# Patient Record
Sex: Female | Born: 1975 | Race: Black or African American | Hispanic: No | Marital: Married | Smoking: Never smoker
Health system: Southern US, Community
[De-identification: ages and names within clinical notes are randomized; demographics above are authoritative.]

## PROBLEM LIST (undated history)

## (undated) ENCOUNTER — Inpatient Hospital Stay (HOSPITAL_COMMUNITY): Payer: Self-pay

## (undated) DIAGNOSIS — IMO0002 Reserved for concepts with insufficient information to code with codable children: Secondary | ICD-10-CM

## (undated) DIAGNOSIS — N189 Chronic kidney disease, unspecified: Secondary | ICD-10-CM

## (undated) DIAGNOSIS — M329 Systemic lupus erythematosus, unspecified: Secondary | ICD-10-CM

## (undated) HISTORY — PX: KIDNEY TRANSPLANT: SHX239

---

## 1999-07-01 ENCOUNTER — Other Ambulatory Visit: Admission: RE | Admit: 1999-07-01 | Discharge: 1999-07-01 | Payer: Self-pay | Admitting: Gynecology

## 1999-11-03 ENCOUNTER — Encounter: Admission: RE | Admit: 1999-11-03 | Discharge: 2000-02-01 | Payer: Self-pay | Admitting: Gynecology

## 2000-01-29 ENCOUNTER — Observation Stay (HOSPITAL_COMMUNITY): Admission: AD | Admit: 2000-01-29 | Discharge: 2000-01-29 | Payer: Self-pay | Admitting: Gynecology

## 2000-01-30 ENCOUNTER — Inpatient Hospital Stay (HOSPITAL_COMMUNITY): Admission: AD | Admit: 2000-01-30 | Discharge: 2000-01-30 | Payer: Self-pay | Admitting: Gynecology

## 2000-01-31 ENCOUNTER — Inpatient Hospital Stay (HOSPITAL_COMMUNITY): Admission: AD | Admit: 2000-01-31 | Discharge: 2000-02-02 | Payer: Self-pay | Admitting: Gynecology

## 2000-03-13 ENCOUNTER — Other Ambulatory Visit: Admission: RE | Admit: 2000-03-13 | Discharge: 2000-03-13 | Payer: Self-pay | Admitting: Gynecology

## 2001-04-14 ENCOUNTER — Emergency Department (HOSPITAL_COMMUNITY): Admission: EM | Admit: 2001-04-14 | Discharge: 2001-04-15 | Payer: Self-pay | Admitting: *Deleted

## 2001-07-24 ENCOUNTER — Other Ambulatory Visit: Admission: RE | Admit: 2001-07-24 | Discharge: 2001-07-24 | Payer: Self-pay | Admitting: Family Medicine

## 2002-11-09 ENCOUNTER — Inpatient Hospital Stay (HOSPITAL_COMMUNITY): Admission: RE | Admit: 2002-11-09 | Discharge: 2002-11-12 | Payer: Self-pay | Admitting: Family Medicine

## 2002-11-09 ENCOUNTER — Encounter: Payer: Self-pay | Admitting: Family Medicine

## 2002-11-10 ENCOUNTER — Encounter: Payer: Self-pay | Admitting: Pulmonary Disease

## 2002-11-11 ENCOUNTER — Encounter: Payer: Self-pay | Admitting: Cardiovascular Disease

## 2002-11-26 ENCOUNTER — Encounter: Admission: RE | Admit: 2002-11-26 | Discharge: 2002-11-26 | Payer: Self-pay | Admitting: Family Medicine

## 2002-11-26 ENCOUNTER — Encounter: Payer: Self-pay | Admitting: Family Medicine

## 2002-12-02 ENCOUNTER — Ambulatory Visit (HOSPITAL_COMMUNITY): Admission: RE | Admit: 2002-12-02 | Discharge: 2002-12-02 | Payer: Self-pay | Admitting: Cardiology

## 2002-12-02 ENCOUNTER — Encounter (INDEPENDENT_AMBULATORY_CARE_PROVIDER_SITE_OTHER): Payer: Self-pay | Admitting: Cardiology

## 2003-05-19 ENCOUNTER — Inpatient Hospital Stay (HOSPITAL_COMMUNITY): Admission: EM | Admit: 2003-05-19 | Discharge: 2003-05-27 | Payer: Self-pay | Admitting: Emergency Medicine

## 2003-05-26 ENCOUNTER — Encounter (INDEPENDENT_AMBULATORY_CARE_PROVIDER_SITE_OTHER): Payer: Self-pay | Admitting: *Deleted

## 2003-06-01 ENCOUNTER — Ambulatory Visit (HOSPITAL_COMMUNITY): Admission: RE | Admit: 2003-06-01 | Discharge: 2003-06-01 | Payer: Self-pay | Admitting: Nephrology

## 2003-06-02 ENCOUNTER — Ambulatory Visit (HOSPITAL_COMMUNITY): Admission: RE | Admit: 2003-06-02 | Discharge: 2003-06-02 | Payer: Self-pay | Admitting: Nephrology

## 2003-06-02 ENCOUNTER — Inpatient Hospital Stay (HOSPITAL_COMMUNITY): Admission: EM | Admit: 2003-06-02 | Discharge: 2003-06-05 | Payer: Self-pay | Admitting: Emergency Medicine

## 2003-06-03 ENCOUNTER — Encounter (INDEPENDENT_AMBULATORY_CARE_PROVIDER_SITE_OTHER): Payer: Self-pay | Admitting: Cardiology

## 2003-06-17 ENCOUNTER — Ambulatory Visit (HOSPITAL_COMMUNITY): Admission: RE | Admit: 2003-06-17 | Discharge: 2003-06-17 | Payer: Self-pay | Admitting: Nephrology

## 2003-07-08 ENCOUNTER — Encounter (HOSPITAL_COMMUNITY): Admission: RE | Admit: 2003-07-08 | Discharge: 2003-10-06 | Payer: Self-pay | Admitting: Nephrology

## 2003-12-09 ENCOUNTER — Ambulatory Visit (HOSPITAL_COMMUNITY): Admission: RE | Admit: 2003-12-09 | Discharge: 2003-12-09 | Payer: Self-pay | Admitting: General Surgery

## 2003-12-10 ENCOUNTER — Observation Stay (HOSPITAL_COMMUNITY): Admission: RE | Admit: 2003-12-10 | Discharge: 2003-12-12 | Payer: Self-pay | Admitting: General Surgery

## 2004-01-26 ENCOUNTER — Ambulatory Visit (HOSPITAL_COMMUNITY): Admission: RE | Admit: 2004-01-26 | Discharge: 2004-01-26 | Payer: Self-pay | Admitting: Nephrology

## 2004-03-19 ENCOUNTER — Emergency Department (HOSPITAL_COMMUNITY): Admission: EM | Admit: 2004-03-19 | Discharge: 2004-03-19 | Payer: Self-pay | Admitting: Emergency Medicine

## 2004-03-29 ENCOUNTER — Inpatient Hospital Stay (HOSPITAL_COMMUNITY): Admission: RE | Admit: 2004-03-29 | Discharge: 2004-04-02 | Payer: Self-pay | Admitting: Nephrology

## 2004-03-29 ENCOUNTER — Encounter (INDEPENDENT_AMBULATORY_CARE_PROVIDER_SITE_OTHER): Payer: Self-pay | Admitting: Cardiology

## 2004-03-30 ENCOUNTER — Encounter (INDEPENDENT_AMBULATORY_CARE_PROVIDER_SITE_OTHER): Payer: Self-pay | Admitting: Cardiology

## 2004-03-30 ENCOUNTER — Encounter (INDEPENDENT_AMBULATORY_CARE_PROVIDER_SITE_OTHER): Payer: Self-pay | Admitting: *Deleted

## 2004-04-01 ENCOUNTER — Encounter (INDEPENDENT_AMBULATORY_CARE_PROVIDER_SITE_OTHER): Payer: Self-pay | Admitting: Cardiovascular Disease

## 2004-06-15 ENCOUNTER — Encounter: Admission: RE | Admit: 2004-06-15 | Discharge: 2004-06-15 | Payer: Self-pay | Admitting: Nephrology

## 2004-07-08 ENCOUNTER — Encounter: Admission: RE | Admit: 2004-07-08 | Discharge: 2004-07-08 | Payer: Self-pay | Admitting: Nephrology

## 2004-07-22 ENCOUNTER — Ambulatory Visit (HOSPITAL_COMMUNITY): Admission: RE | Admit: 2004-07-22 | Discharge: 2004-07-22 | Payer: Self-pay | Admitting: Nephrology

## 2004-07-22 ENCOUNTER — Encounter (INDEPENDENT_AMBULATORY_CARE_PROVIDER_SITE_OTHER): Payer: Self-pay | Admitting: *Deleted

## 2004-07-27 ENCOUNTER — Other Ambulatory Visit: Admission: RE | Admit: 2004-07-27 | Discharge: 2004-07-27 | Payer: Self-pay | Admitting: Family Medicine

## 2004-08-12 ENCOUNTER — Encounter: Admission: RE | Admit: 2004-08-12 | Discharge: 2004-08-12 | Payer: Self-pay | Admitting: Nephrology

## 2004-08-27 ENCOUNTER — Inpatient Hospital Stay (HOSPITAL_COMMUNITY): Admission: EM | Admit: 2004-08-27 | Discharge: 2004-09-01 | Payer: Self-pay | Admitting: Emergency Medicine

## 2004-10-14 ENCOUNTER — Ambulatory Visit (HOSPITAL_COMMUNITY): Admission: RE | Admit: 2004-10-14 | Discharge: 2004-10-14 | Payer: Self-pay | Admitting: Gastroenterology

## 2004-10-14 ENCOUNTER — Encounter (INDEPENDENT_AMBULATORY_CARE_PROVIDER_SITE_OTHER): Payer: Self-pay | Admitting: Specialist

## 2004-10-26 ENCOUNTER — Inpatient Hospital Stay (HOSPITAL_COMMUNITY): Admission: EM | Admit: 2004-10-26 | Discharge: 2004-10-29 | Payer: Self-pay | Admitting: Emergency Medicine

## 2004-12-14 ENCOUNTER — Ambulatory Visit (HOSPITAL_COMMUNITY): Admission: RE | Admit: 2004-12-14 | Discharge: 2004-12-15 | Payer: Self-pay | Admitting: Nephrology

## 2004-12-22 ENCOUNTER — Ambulatory Visit (HOSPITAL_COMMUNITY): Admission: RE | Admit: 2004-12-22 | Discharge: 2004-12-22 | Payer: Self-pay | Admitting: Nephrology

## 2005-01-14 ENCOUNTER — Ambulatory Visit (HOSPITAL_COMMUNITY): Admission: RE | Admit: 2005-01-14 | Discharge: 2005-01-14 | Payer: Self-pay | Admitting: Nephrology

## 2005-01-17 ENCOUNTER — Ambulatory Visit (HOSPITAL_COMMUNITY): Admission: RE | Admit: 2005-01-17 | Discharge: 2005-01-17 | Payer: Self-pay | Admitting: Nephrology

## 2005-08-02 ENCOUNTER — Ambulatory Visit (HOSPITAL_COMMUNITY): Admission: RE | Admit: 2005-08-02 | Discharge: 2005-08-02 | Payer: Self-pay | Admitting: Nephrology

## 2005-09-06 ENCOUNTER — Ambulatory Visit (HOSPITAL_COMMUNITY): Admission: RE | Admit: 2005-09-06 | Discharge: 2005-09-06 | Payer: Self-pay | Admitting: Nephrology

## 2006-03-31 ENCOUNTER — Encounter (HOSPITAL_COMMUNITY): Admission: RE | Admit: 2006-03-31 | Discharge: 2006-03-31 | Payer: Self-pay | Admitting: Family Medicine

## 2010-06-24 ENCOUNTER — Emergency Department (HOSPITAL_COMMUNITY)
Admission: EM | Admit: 2010-06-24 | Discharge: 2010-06-25 | Payer: Self-pay | Source: Home / Self Care | Admitting: Emergency Medicine

## 2010-06-26 ENCOUNTER — Encounter: Payer: Self-pay | Admitting: Nephrology

## 2010-06-27 ENCOUNTER — Encounter: Payer: Self-pay | Admitting: Gastroenterology

## 2010-06-27 ENCOUNTER — Encounter: Payer: Self-pay | Admitting: Nephrology

## 2010-10-22 NOTE — H&P (Signed)
NAME:  [name] [name] [name], [name] [name] [name]                       ACCOUNT NO.:  [id number]   MEDICAL RECORD NO.:  [id number]                   PATIENT TYPE:  INP   LOCATION:  0455                                 FACILITY:  [facility]   PHYSICIAN:  [name] [name] [name], M.D.             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 Penni [name] [name] for symptoms of fever, nasal congestion, cough  and headache which improved.  However, 24 hours prior to this admission the  patient started having [name] [name] progressive cough with fever and chills, some mild  shortness of breath along with minor nasal congestion.  She had myalgias and  vomiting x 3 with six mushy stools over 24 hours.  Pulse ox was 95 at [facility].  She also was noted to have anemia with [name] [name] hemoglobin of 7.4.  She was felt to be dehydrated with probable pneumonia and direct admission  from [facility] was arranged by Dr. Heather [name] [name].  Her history is  complicated by the fact that since birth of her baby by cesarean section on  July 08, 2002 she has had only five days of no vaginal bleeding.  She is  apparently requiring 2-4 changes of menstrual pads per day and certainly  this may relate to her anemia.  She has reportedly drunk up to [name] [name] gallon of  liquids in the past 24+ hours.  [name] [name] chest x-ray on admission showed some  effusions and mild cardiomegaly.   PAST MEDICAL HISTORY:   ALLERGIES:  None.   MEDICATIONS:  No regular medications.   FAMILY HISTORY:  Noncontributory to this admission.   HOSPITALIZATIONS/OPERATIONS:  1. T&[name] [name].  2. Two children.  No miscarriages.  Both children were born by cesarean     section.  3. Abnormal Pap smear in 1998 with [name] [name] LEEP procedure.   REVIEW OF SYSTEMS:  The  patient is not [name] [name] smoker or alcohol user.  Her  general health has been good.  She is not aware of any anemia other then  this past pregnancy according to her.  She does not know her __________.  She has had no history of pneumonia or cardiac problems or other ongoing  health problems.   PHYSICAL EXAMINATION:  VITAL SIGNS:  On admission temp 101.7, respirations  18-20, pulse 110, blood pressure 120/58 with [name] [name] 10 mm systolic drop.  SKIN:  Warm, dry, no rash or edema.  ENT:  Minor nasal clear rhinorrhea without other abnormalities, no nodes or  masses.  CARDIORESPIRATORY :  Scattered rhonchi with occasional rales at the left  base.  There is minimal to moderate dyspnea.  No murmur is heard.  ABDOMEN:  Soft, nontender.  No masses or organomegaly.  RECTAL:  Shows guaiac negative stool.  GU:  Deferred, as GYN consultation was to be obtained on Sunday morning.  NEUROLOGIC:  Alert, oriented, no focal abnormality.    IMPRESSION:  1. On admission probable pneumonia with cardiomegaly of undetermined     etiology.  2. Anemia probably related to blood loss, further workup pending.  3. Abnormal gynecologic bleeding.   PLAN:  Admit, hydrate, IV antibiotics, pulmonary ________ consultation.                                                [name] [name] [name], M.D.    SCW/MEDQ  D:  11/09/2002  T:  11/10/2002  Job:  [id number]

## 2010-10-22 NOTE — Op Note (Signed)
[facility]  Patient:    [name] [name], [name] [name]                 MRN: [id number] Proc. Date: 01/31/00 Adm. Date:  [id number] Disc. Date: [id number] Attending:  Tonye Royalty                           Operative Report  PREOPERATIVE DIAGNOSES:       Cephalopelvic disproportion, arrest of dilation.  POSTOPERATIVE DIAGNOSES:      Cephalopelvic disproportion, arrest of dilation, OP presentation.  PROCEDURE:                    Primary cesarean section, low flap transverse.  SURGEON:                      Douglass Rivers, M.D.  ASSISTANT:                    Scrub technician.  ANESTHESIA:                   Spinal.  ESTIMATED BLOOD LOSS:         800 cc.  FINDINGS:                     Viable female infant in the OP presentation. Clear amniotic fluid.  Apgars 9 and 9.  Birth weight 6 pounds 8 ounces. Normal uterus, tubes, and ovaries.  COMPLICATIONS:                None.  PATHOLOGY:                    None.  PROCEDURE:                    The patient was taken to the operating room. Spinal anesthesia was induced and placed in the supine position with left lateral displacement and prepped and draped in the usual sterile fashion. After adequate anesthesia was ensured, a Pfannenstiel skin incision was made with a scalpel and carried to the underlying layer of fascia with a second blade.  The fascia was scored in the midline.  Incision was extended laterally with the Mayo scissors.  Inferior aspect of the fascial incision was grasped with Kochers and the underlying rectus muscles were dissected off by blunt and sharp dissection in a similar fashion.  The superior aspect of the fascial incision was grasped with Kochers, the underlying rectus muscles were dissected off.  The rectus muscles were separated in the midline, the peritoneum was identified, and entered by blunt dissection.  The peritoneal incision was then extended superiorly and inferiorly with  good visualization of underlying bowel and bladder. The orientation to be used was confirmed. The bladder blade was then inserted.  The vesicouterine peritoneum was identified, tented up, and entered sharply with the Metzenbaums.  The incision was extended laterally.  The bladder flap was created digitally.  The bladder blade was then reinserted and the lower uterine segments, incised in a transverse fashion with the scalpel.  Clear amniotic fluid was verified upon entering the cavity.  The infant was delivered from the OP presentation.  The cord was cut and clamped and the infant was handed off to the awaiting pediatricians.  Cord bloods were obtained.  The uterus was massaged and the placenta was allowed to separate naturally.  The uterus was cleared  of all clots and debris.  The uterine incision was then repaired with a running locked layer of 0 Chromic.  There was some bleeding noted in the midportion of the incision and a second suture was used to imbricate over this area; hemostasis was ensured.  The gutters were cleared of all clots and debris. The pelvis was irrigated copious amounts of warm normal saline and hemostasis again, was reassured.  There was some bleeding on the visceral peritoneum which was treated with electrocautery; the parietal peritoneum was noted to be hemostatic.  The muscle areas were treated with cautery.  The fascia was closed with 0 Vicryl.  The subcu was irrigated.  The skin was closed with staples.  The patient tolerated the procedure well.  Sponge, lap, needle counts were correct x 2.  She was given Ancef intraoperatively and transferred to the PACU in stable condition. DD:  01/31/00 TD:  01/31/00 Job: [id number]

## 2010-10-22 NOTE — Discharge Summary (Signed)
NAME:  [name] [name], [name] [name]                       ACCOUNT NO.:  [id number]   MEDICAL RECORD NO.:  [id number]                   PATIENT TYPE:  INP   LOCATION:  5530                                 FACILITY:  [facility]   PHYSICIAN:  Alvin C. Lowell Guitar, M.D.               DATE OF BIRTH:  Feb 24, 1976   DATE OF ADMISSION:  06/02/2003  DATE OF DISCHARGE:  06/05/2003                                 DISCHARGE SUMMARY   DISCHARGE DIAGNOSES:  1. Left perinephric hematoma.  2. Anemia secondary to blood loss from #1.  3. Systemic lupus erythematosus.  4. Pericardial effusion.  5. Left pleural effusion.  6. Hypertension.  7. Hypotension.  8. End-stage renal disease.  9. Leukocytosis.   CONSULTATIONS:  Dr. Elsie Lincoln.   PROCEDURE:  1. Hemodialysis.  2. 2-echocardiogram on June 03, 2003, showed: 1) left ventricular     ejection fraction of 45%; 2) estimated aortic valve area of 2.5 cm; 3)     right ventricular size in the upper limits of normal; 4) mild to moderate     tricuspid valvular regurgitation; 5) right atrium dilated; 6) small to     moderate free flowing echo-free pericardial effusion circumferential to     the heart with associated hematoma; 7) left pleural effusion).  3. CAT scan of the abdomen and chest with IV contrast on June 03, 2003,     showing no evidence of aortic dissection, moderate size pericardial     effusion, axillary and supraclavicular lymphadenopathy, moderate size     left pleural effusion, and left lower lobe collapse. Right lower lobe     atelectasis versus airspace disease. Large left retroperitoneal hematoma     displacing the left kidney.  Trace amount of perinephric ascites.   HISTORY OF PRESENT ILLNESS:  Ms. [name] [name] is [name] pleasant 35 year old female with  [name] history of systemic lupus erythematosus, status post percutaneous renal  biopsy on December 20th. She was noted on her monthly labs at the kidney  center to have [name] hemoglobin of 7.6 and rechecked on the 26th  was 6.5, and  the patient was transfused two units of packed red cells on June 02, 2003.  She also had [name] CAT scan done at the time of transfusion and was  discharged home, and later results were called to Dr. Lowell Guitar, the on-call  physician, because the CAT scan showed [name] large left perinephric hematoma.  The patient complained of left flank and abdominal discomfort. She was  admitted for further evaluation, monitoring, and intervention as needed. The  remainder of the physical examination is outlined in admission history and  physical.   HOSPITAL COURSE:   PROBLEM #1:  Left perinephric hematoma. Admission labs after transfusion  showed [name] hemoglobin of 9.6, hematocrit 29, platelet count 385,000, white  count 15.8 with 81% neutrophils, 15% lymphocytes, 4% monocytes. Sodium 134,  potassium 3.4, chloride 94, CO2 30, glucose 115, BUN 25, creatinine  5.5,  calcium 8.2.  She had [name] repeat CAT scan on the 28th which did not show any  further increase in size. She has serial hemodialysis treatments. She will  be discharged on no heparin dialysis for probably at least [name] month.  Hemoglobin is to be treated times two weeks. Hemoglobin at the time of  discharge was 9.1.  Dr. Briant Cedar received preliminary biopsy results which  showed sclerotic tissue with some interstitial infiltrates, but not severe  scarring.  If there is still [name] chance of discovery, the patient is willing  to receive Cytoxan once the final lab results are back. She wants to do  everything possible and understands the risks taking Cytoxan and prednisone.  She had [name] repeat HIV test which was negative.  Dr. Briant Cedar will follow the  patient regarding Cytoxan infusion once final biopsy results return from  [facility].   PROBLEM #2:  Anemia secondary to #1.  As discussed in #1, her hemoglobin did  drop down, but stabilized. She will be on heparin and serial hemoglobins as  outlined above. Her Epogen dose is increased at 15,000  units IV per  hemodialysis and she is to receive InFeD 100 mg IV every week.   PROBLEM #3:  Pericardial effusion. The patient had pericardial effusion  noted on her admission CT on the day of transfusion.  Her medical record  shows she had had one in the past.  It was not known if this had increased.  The patient was hypotensive.  The early part of her admission which was  probably related to anemia as well as apart from medication. Dr. Elsie Lincoln saw  the patient in consultation as he read her 2-D echocardiogram and he felt  there was no chance of tamponade. She was dialyzed daily during her  hospitalization. We were able to lower her weight to 4 kg. Her blood  pressure was up by the end of discharge and we will saturate her weight down  further in the outpatient setting.   PROBLEM #4:  Left pleural effusion. The patient had left pleural effusion  noted on her CAT scan, which will be improved on serial hemodialysis   PROBLEM #5:  Hypertension. The patient's blood pressure medications have  been titrated down at the time of discharge. Her Procardia was down to 30 mg  at bedtime. She was on Coreg, [name] very low dose of 3.125 mg, on Monday,  Wednesday, and Friday, Sunday, and Saturday. Lanoxin 0.125 mg the same day.  Dry weight will be lowered as needed; however, with her feeling better she  may actually regain some of her lost weight. Either way her blood pressure  will be controlled in the outpatient setting.   PROBLEM #6:  Hypotension.   PROBLEM #7:  End-stage renal disease.  The patient received dialysis during  hospitalization. She is using [name] dialysis catheter. She is very interested in  peritoneal dialysis after discharge and we will make arrangements for her to  meet with home training. Previously, she is on no-heparin dialysis.  Potassium has been controlled during her hospitalization as has been her phosphorus. BUN and creatinine pre-dialysis on the day of discharge was 31  and 4.9  respectively with potassium of 4.7, hemoglobin 9.1.   PROBLEM #8:  Leukocytosis. The patient had blood cultures drawn due to  leukocytosis. She was afebrile except for day of admission. She was treated  empirically with Ancef. She does have [name] catheter and will continue on Ancef  through January 6th. Blood cultures showed no growth. There was not much  change in her white count. It was 14.7 at the time of discharge.   CONDITION ON DISCHARGE:  Improved.   DISCHARGE MEDICATIONS:  1. Prednisone 30 mg per day; physician to adjust at dialysis center.  2. Nephro-Vite one tablet per day.  3. Coreg 3.125 mg on Monday, Wednesday, Friday, Saturday, and Sunday.  4. Lanoxin 0.125 mg on Monday, Wednesday, Friday, Saturday, and Sunday.  5. Procardia XL 30 mg q.h.s.  6. Calcium carbonate or Tums to alternate with two tablets with meals t.i.d.  7. Tylenol p.r.n. pain or fever.   ACTIVITY:  As tolerated.   DISCHARGE DIET:  60-gm protein, 2-gm sodium, 2-gm potassium diet. Limit  fluids to 5 cups per day.   SPECIAL INSTRUCTIONS:  Discussed when to return to work. May return part-  time on January 6th and full-time January 10th. The patient has [name] follow-up  appointment with Dr. Jenne Campus on January 24th at 3:30 p.m.  Her next dialysis  treatment will be January 2nd.  Exemplar is 3 mcg IV at each hemodialysis,  InFeD dose 100 mg IV each week, Epogen 15,000  units IV each hemodialysis,  Ancef 2 gm IV each hemodialysis through January 16th. She is to have no-  heparin dialysis until advised by rounding physician.  Hemodialysis at each  treatment times two weeks. Hemoglobin at discharge 9.1. Estimated dry weight  48.5, titrate down as per blood pressure dictates. She is to receive [name] home  training consult to evaluate for peritoneal dialysis.      Weston Settle, P.[name].                      Mindi Slicker. Lowell Guitar, M.D.    MB/MEDQ  D:  07/11/2003  T:  07/12/2003  Job:  [id number]   cc:   Mindi Slicker. Lowell Guitar, M.D.  [address]  [place]  [place] [address]  Fax: [phone]   Madaline Savage, M.D.  [phone]  [place]  [place] [address]  Fax: [phone]   [facility]

## 2010-10-22 NOTE — Discharge Summary (Signed)
[name] [name], [name] [name]             ACCOUNT NO.:  [id number]   MEDICAL RECORD NO.:  [id number]          PATIENT TYPE:  INP   LOCATION:  5525                         FACILITY:  [facility]   PHYSICIAN:  Garnetta Buddy, M.D.   DATE OF BIRTH:  22-Jul-1975   DATE OF ADMISSION:  10/26/2004  DATE OF DISCHARGE:  10/29/2004                                 DISCHARGE SUMMARY   ADMISSION DIAGNOSES:  1.  Fever and chills, rule out sepsis.  2.  End-stage renal disease on chronic hemodialysis.  3.  Systemic lupus erythematosus.  4.  Elevated liver function tests.   DISCHARGE DIAGNOSES:  1.  Staphylococcus aureus bacteremia with sensitivities pending.  2.  End-stage renal disease on chronic hemodialysis.  3.  Systemic lupus erythematosus.   __________ at the [facility], presents with a three-day  history of fevers, chills, tenderness over her dialysis catheter, internal  arthralgias, myalgias, nausea, decreased appetite.  She did not attend  dialysis Oct 25, 2004 due to not feeling well.  She has a right IJ PermaCath  __________ recent past, March 2006.  That Staph was methicillin-sensitive.  However, she was treated with vancomycin due to an Ancef allergy.   ADMISSION LABORATORY DATA:  White count 10,200 with 83 segs, 10 lymphocytes,  6 monocytes, 1 eosinophil, 0 basophils, hemoglobin 10.7, platelets 235,000.  Sodium 126, potassium 6.0, chloride 90, CO2 26, glucose 87, BUN 71,  creatinine 16.6.  Albumin 2.9, calcium 8.0, SGOT 128, SGPT 94, alk. phos.  44, total bilirubin __________.   HOSPITAL COURSE:  1.  Staphylococcus bacteremia.  After blood cultures were obtained, the      patient was empirically placed on vancomycin and Cipro.  She was given      stress doses of steroids IV.  Her high potassium was treated with      emergency dialysis and resolved.  Her vitals were stable and within 24      hours, her hydrocortisone was switched to prednisone 30 mg a day.  Blood      cultures  were reported positive within 24 hours as growing gram-positive      cocci in clusters. __________ pending at the time of this dictation.      Cipro was stopped, and she was continued on vancomycin 500 mg IV after      each dialysis.  Her fevers defervesced.  She dialyzed on the day of      discharge without any temperature spikes, felt well and was discharged      home without having her PermaCath removed at this time.  She will be      maintained on vancomycin 500 mg IV each dialysis through November 15, 2004,      giving her a three-week course.  Should she have any recurrence of fever      or chills, she will need this PermaCath removed at that time.   Sensitivities need to be followed up, and this will be deferred to the  [facility].  She was discharged home on her usual dose of  prednisone at 20 mg daily.  Other medications were to be resumed as prior to  admission.   1.  Elevated liver function tests.  She has had elevated LFTs as an      outpatient.  She states to me that her transplant center has worked this      up and has not found any etiology.  They were elevated on admission and      on the day of discharge, they have come down to an SGOT of 85 and SGPT      of 80.  Bilirubin and alk. phos. are within normal limits.  We will      follow this up as an outpatient also.   DISCHARGE MEDICATIONS:  1.  Prednisone 20 mg daily.  2.  Nephrovite vitamin 1 daily.  3.  Protonix 40 mg at bedtime.  4.  PhosLo 667 mg 3 with meals.  5.  EPO 20,000 units IV each dialysis.  6.  Vancomycin 500 mg IV each dialysis through November 15, 2004.   DISCHARGE INSTRUCTIONS:  Dialysis Monday, Wednesday and Friday at the  [facility].  Her dry weight is unchanged.  She is reminded to  follow her kidney failure diet with fluid restrictions.       RRK/MEDQ  D:  10/29/2004  T:  10/30/2004  Job:  [id number]   cc:   [facility]   [facility]

## 2010-10-22 NOTE — Discharge Summary (Signed)
NAME:  [name] [name], [name] [name]                       ACCOUNT NO.:  [id number]   MEDICAL RECORD NO.:  [id number]                   PATIENT TYPE:  INP   LOCATION:  0455                                 FACILITY:  [facility]   PHYSICIAN:  Quita Skye. Artis Flock, M.D.                DATE OF BIRTH:  06-26-1975   DATE OF ADMISSION:  11/09/2002  DATE OF DISCHARGE:  11/12/2002                                 DISCHARGE SUMMARY   ADMISSION DIAGNOSES:  1. Pneumonia, right upper lobe and right lower lobe.  2. Dysfunctional uterine bleeding.  3. Anemia probably secondary to #2.  4. Cardiomegaly.   DISCHARGE DIAGNOSES:  1. Pneumonia, right upper lobe and right lower lobe.  2. Dysfunctional uterine bleeding.  3. Anemia probably secondary to #2.  4. Cardiomegaly.   SPECIAL PROCEDURES:  None.   CONSULTATIONS:  1. [name], M.D. [facility] from [facility] Pulmonary service.  2. [name], M.D., gynecology.   HOSPITAL COURSE:  This 35 year old black female was admitted by [name], M.D. on-call for me at the time on November 09, 2002 with 24-hour history  of progressive cough, fever, chills, shortness of breath, myalgias, and  vomiting.  She was seen at [facility] where she had [name] pulse  oximetry of 95, anemia with [name] hemoglobin of 7.4, and [name] chest x-ray showing  right-sided infiltrates with cardiomegaly.  At the time of admission her  temperature was 101.7, respirations 18-20, pulse 110, blood pressure 120/58.  She had right upper and right lower lobe rales and rhonchi to auscultation  and she was admitted with diagnosis of pneumonia, anemia.  She also reported  having continuous vaginal bleeding since [name] cesarean section July 08, 2002  requiring two to four changes of menstrual pads per day.  She was started on  IV Rocephin and Avelox.  She was seen by Dr. [name] in pulmonary  consultation and it was suggested, based on her chest x-ray appearance of  cardiomegaly, to obtain [name]  cardiac echo and BNP.  The patient was transfused  2 units of red blood cells, blood type [name]+.  She steadily improved having an  uncomplicated hospital course overall.  Dr. Sung Amabile signed off on her case  after 48 hours suggesting 10-day completion of antibiotics and GYN  evaluation of her anemia and dysfunctional uterine bleeding and suggested  follow-up chest x-ray two to four weeks after discharge.  The patient was  seen in consultation by [name] for the dysfunctional uterine  bleeding.  Consult note is seen in the chart.  He suggested hCG, prolactin  testing, and starting of Ortho Evra patch and she would be seen as an  outpatient after discharge for follow-up.   LABORATORY DATA:  The patient's admission CBC showed [name] hemoglobin of 7.4.  On June 7 patient's hemoglobin was 8.7, hematocrit 26.6, white blood cell  count had dropped from admission  of 19,300 down to 8800.  MCV was 78.7, MCHC  32.7, platelet count normal at 357,000.  Stool occult blood test was  negative.  PT, PTT, and INR were normal.  BMET profile showed normal  electrolytes.  BUN was 23, creatinine 1.9, calcium 7.1.  B-natriuretic  peptide was elevated at 163 with high normal of 100.  T4 was slightly low at  4.6, low normal being 5.  TSH was normal at 2.393.  Free T3 was 1.5.  B12,  folate, and ferritin levels were normal.  Serum pregnancy test was negative.  Prolactin level was normal at 11.2.  Blood cultures showed no growth after  five days x2.  Urine culture showed no growth.  Sputum culture showed smear  with moderate squamous epithelial cells, yeast cells.  Culture grew few  yeast with Candida species.  Cardiac echocardiogram showed moderately  dilated left ventricle, mildly decreased overall left ventricular systolic  function, ejection fraction estimated between 40-50%, mild mitral valve  regurgitation, small pericardial effusion with no tamponade.  EKG showed  normal sinus rhythm.  Chest x-ray showed  cardiomegaly with mild interstitial  edema, right upper lobe and right lower lobe infiltrates.   At the time of discharge on November 12, 2002 the patient was feeling much  better.  Her cough was decreased.  She had no nausea or vomiting.  She was  afebrile.  Lungs were essentially clear to auscultation.  Heart revealed  regular rate and rhythm without murmurs.  Abdomen was benign.  She was  discharged in [name] much improved condition.  She was to continue on Avelox 400  mg once [name] day for one week after discharge.  She was started on the Ortho  Evra patch one patch per week and she was to follow up with [name]. as scheduled.  Anemia still persisted and would be further  evaluated as an outpatient.  Acute renal insufficiency had improved and was  to be rechecked as an outpatient two weeks after discharge.  She was to  follow up with Dr. Artis Flock two weeks after discharge with [name] chest x-ray and  office visit.  She was to continue on regular diet and activity.                                               Quita Skye Artis Flock, M.D.    JDK/MEDQ  D:  12/25/2002  T:  12/25/2002  Job:  [id number]   cc:   Oley Balm. Sung Amabile, M.D. [facility]   Timothy P. Audie Box, M.D.  [address], [address]  [place]  [place] [address]  Fax: [phone]

## 2010-10-22 NOTE — Op Note (Signed)
NAME:  [name] [name], [name] [name]                       ACCOUNT NO.:  [id number]   MEDICAL RECORD NO.:  [id number]                   PATIENT TYPE:  INP   LOCATION:  3314                                 FACILITY:  [facility]   PHYSICIAN:  Garnetta Buddy, M.D.                DATE OF BIRTH:  09/11/1975   DATE OF PROCEDURE:  05/19/2003  DATE OF DISCHARGE:                                 OPERATIVE REPORT   OPERATION PERFORMED:  Placement of [name] right-sided femoral catheter for  hemodialysis.   SURGEON:  Garnetta Buddy, M.D.   DESCRIPTION OF PROCEDURE:  The patient was laid supine on the table.  Intravenous lidocaine was used in order to anesthetize the skin.  The  patient was consented and the risks and benefits of the procedure explained.  The anterior femoral triangle was delineated using ultrasound device and the  femoral vein was located.  The area was prepped and draped in the usual  sterile fashion.  2% lidocaine without epinephrine was used in order to  infiltrate the area of interest.  Using the Seldinger technique, [name] hollow  bore 18 gauge needle was introduced using [name] guide through the ultrasound  device.  [name] flash back of venous blood was noted as the needle entered the  vein under direct visualization.   [name] guidewire was introduced through the hollow bore needle and advanced  slowly through without resistance.  The hollow bore needle was removed and [name]  small incision approximately 1 cm was made using [name] sharp scalpel blade in [name]  superomedial direction.  [name] dilator was introduced over the guidewire and the  area was dissected free into the vein using blunt dissection.  [name] 20 cm  hemodialysis catheter was introduced over the guidewire and threaded through  into the femoral vein.  [name] flash back of venous blood was noted in the  dialysis catheter.  The catheter was flushed with normal saline and blocked  with 1 in 5000 units of heparin.  The area was sutured in place and secured.  The patient  tolerated the procedure well.  There were no complications.                                               Garnetta Buddy, M.D.    MWW/MEDQ  D:  05/19/2003  T:  05/20/2003  Job:  [id number]

## 2010-10-22 NOTE — Op Note (Signed)
NAME:  [name] [name], [name] [name]                       ACCOUNT NO.:  [id number]   MEDICAL RECORD NO.:  [id number]                   PATIENT TYPE:  INP   LOCATION:  3712                                 FACILITY:  [facility]   PHYSICIAN:  Dyke Maes, M.D.          DATE OF BIRTH:  1975-07-27   DATE OF PROCEDURE:  05/26/2003  DATE OF DISCHARGE:                                 OPERATIVE REPORT   PROCEDURE PERFORMED:  Left percutaneous renal biopsy.   INDICATIONS FOR PROCEDURE:  Lupus nephritis.   The risks of the procedure were explained to the patient and informed  consent obtained.   DESCRIPTION OF PROCEDURE:  The patient was placed in prone position and the  left flank was prepped and draped in sterile fashion and local anesthesia  was obtained with 10 mL of 1% Xylocaine.  Under direct ultrasound guidance,  [name] 15 gauge ASAP biopsy needle was placed in the inferior pole of the left  kidney on two separate occasions with return of two cores of renal tissue.  The patient tolerated the procedure well.                                               Dyke Maes, M.D.    MTM/MEDQ  D:  05/26/2003  T:  05/26/2003  Job:  [id number]

## 2010-10-22 NOTE — H&P (Signed)
NAME:  [name] [name], [name] [name]             ACCOUNT NO.:  [id number]   MEDICAL RECORD NO.:  [id number]          PATIENT TYPE:  INP   LOCATION:  1831                         FACILITY:  [facility]   PHYSICIAN:  Garnetta Buddy, M.D.   DATE OF BIRTH:  [birth year]   DATE OF ADMISSION:  10/26/2004  DATE OF DISCHARGE:                                HISTORY & PHYSICAL   HISTORY OF PRESENT ILLNESS:  A 35 year old African American female with end-  stage renal disease with systemic lupus erythematosus with a three-day  history of fevers, sweats, chills, and tenderness over the dialysis  catheter, internal arthralgias, myalgias, also complaining of nausea,  decrease appetite, and missed a dialysis on Monday due to not feeling well.   PAST MEDICAL HISTORY:  1.  End-stage renal disease December 2004.  2.  Normal Pap status post LEEP in 1998.  3.  Hypertension.  4.  Pericardial effusion, status post pericardiocentesis in 2005.  5.  Failed peritoneal dialysis secondary to noncompliance.  6.  Systemic lupus erythematosus.  7.  Status post liver biopsy secondary to increased liver enzymes with      diagnosis of iron overload.  8.  History of Staphylococcus catheterized sepsis March 2006.  9.  Coagulase negative Staphylococcus sepsis in 2004.   MEDICATIONS:  1.  Prednisone 20 mg daily.  2.  Protonix 40 mg daily.  3.  PhosLo 67 mg three tablets q.a.c.   ALLERGIES:  ANCEF.   SOCIAL HISTORY:  Runner, broadcasting/film/video. Disabled. No tobacco and no alcohol. Married with  two children.   FAMILY HISTORY:  Noncontributory.   REVIEW OF SYMPTOMS:  GENERAL:  Positive for fever, weakness, and fatigue.  EYES:  No visual complaints. EARS/NOSE/THROAT:  He has no dental pain, no  discharge, and no drainage. CARDIOVASCULAR:  No anginal or orthopnea. No  PND. No syncope. RESPIRATORY:  No cough, no wheeze. ABDOMEN:  No abdominal  pain, nausea, vomiting, or diarrhea. GENITOURINARY:  Amenorrhea. Small  amounts of urine. No dysuria.  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 No CVA. Denies numbness sensation with diplopia and paraesthesia.  ENDOCRINE:  Negative for diabetes, thyroid dyslipidemia. MUSCULOSKELETAL:  Positive for arthralgias and myalgias. HEMATOLOGY/ONCOLOGIC:  History of  abdominal Pap with LEEP procedure.   PHYSICAL EXAMINATION:  GENERAL:  Pleasant, ill-appearing lady in no obvious  distress.  VITAL SIGNS:  Blood pressure 140/80, pulse 104, temperature 104.4,  respiratory rate 12. Pulse oximeter 98%.  HEENT:  Normocephalic and atraumatic. Pupils are equal, round, and reactive.  No pallor and no icterus. Ears, nose, mouth, throat, and tongue appearance  is normal. Nasal mucosa is clear. Oropharynx is clear.  NECK:  Tender around internal catheter. Expressed pus from exit site.  CARDIOVASCULAR:  Regular rate and rhythm. Hyperdynamic tachycardic.  LUNGS:  Clear to auscultation and percussion.  ABDOMEN:  Soft and nontender. Bowel sounds present.  EXTREMITIES:  No clubbing, cyanosis, or edema. Pulses 2+ peripheral and  intact.   LABORATORY DATA:  Labs pending. Chest x-ray pending. EKG pending.   ASSESSMENT/PLAN:  1.  Internal infection with treatment of vancomycin and Cipro 400 mg  intravenous q.24h. Check blood cultures, urine cultures, and chest x-      ray.  2.  End-stage renal disease with hemodialysis. We will need records from      both [facility] in the a.m.  3.  Anemia:  Continue Epogen.  4.  Hypoparathyroidism:  Continue vitamin D and binders.  5.  Diet:  Follow a renal diet.  6.  Chronic steroid use:  We will add stress dose of hydrocortisone 50 mg      q.8h.       MWW/MEDQ  D:  10/26/2004  T:  10/26/2004  Job:  [id number]

## 2010-10-22 NOTE — Cardiovascular Report (Signed)
NAME:  [name] [name], [name] [name]             ACCOUNT NO.:  [id number]   MEDICAL RECORD NO.:  [id number]          PATIENT TYPE:  INP   LOCATION:  2399                         FACILITY:  [facility]   PHYSICIAN:  Richard A. Alanda Amass, M.D.DATE OF BIRTH:  May 23, 1976   DATE OF PROCEDURE:  03/30/2004  DATE OF DISCHARGE:                              CARDIAC CATHETERIZATION   PROCEDURE:  Right heart catheterization, thermodilution cardiac output,  right heart pressures pre and post pericardial centesis, pericardial  centesis via subcutaneous subxiphoid approach with percutaneous pericardial  drainage.   BRIEF HISTORY:  Ms. Scripter is a 35 year old African American married mother of  two children and former school Runner, broadcasting/film/video.  She has a history of end stage  renal disease presumably related to SLE.  She had been seen by Dr. Jenne Campus  in the past for nonischemic cardiomyopathy associated with her end stage  renal disease.  She had been on chronic steroid therapy and had a small  pericardial effusion when evaluated December 2004.  She had been on short  term hemodialysis but was transferred over to peritoneal dialysis, however,  the patient was noncompliant and did not complete this.  She presented on  this occasion to the renal service for progressive renal failure and  necessity of resuming hemodialysis.  A Diatech catheter was replaced by Dr.  Darrick Penna.  Upon placement, a large pericardial effusion was noted by  fluoroscopy.  This was confirmed by 2D echo and radiology and there was  concern about partial right atrial collapse suggesting mild tamponade  physiology.  She had a mild pulses paradoxicus of approximately 8-10 mmHg  and was not in any hemodynamic distress, and was begun on hemodialysis  earlier today which she tolerated.  She was referred for pericardial  centesis in this setting.  She has associated secondary hypothyroidism,  remote history of seizures, iron deficiency anemia, systemic  hypertension,  and as noted, prior noncompliance with peritoneal dialysis.  Informed  consent was obtained to proceed with the procedure.   DESCRIPTION OF PROCEDURE:  She was brought to the second floor CP lab in a  postabsorptive state.  There was difficulty with IV access and a right  common femoral vein sheath was placed using modified Seldinger technique  with 1% Xylocaine anesthesia and an 8 [place] short venous side-arm sheath  was placed and used for venous access.  This was also used for performance  of right heart catheterization with a triple lumen flow directed  thermodilution catheter.  This was done under fluoroscopic control.  Cardiac  output was done by thermodilution.   Pre-pericardial centesis pressures:  RA 6/9, mean 5 mmHg.  RV 39/4, RVEDP 6 mmHg.  PA 38/25, mean 30 mmHg.  PCW 9/10, mean 8 mmHg.  CO/CI 7.0/4.9 L/MIN/M square.   We then proceeded with pericardial centesis.  After venous access was  obtained, the patient was given intermittent Fentanyl and Versed for  sedation during the procedure and received a total of 62.5 mg Fentanyl and  2.5 mg Versed in divided doses.  1% Xylocaine was used to anesthetize the  subxiphoid position.  Using a v1  alligator clamp electrode and 18 thin Doubrava  spinal needle, the needle was directed towards the pericardium and RV area.  Initially, the pericardium was not entered.  A second attempt was done and  the pericardial sac was entered with serosanguineous fluid aspirated.  A  0.025 J-tip Teflon coated guide-wire was then used under fluoroscopic  control through the needle and threaded into the pericardial space confirmed  by fluoroscopy and passage of the wire.  A 5 French dilator was then passed  over the wire, the wire was removed.  Pericardial aspiration was confirmed  and a second longer 0.035 inch guide-wire was passed into the pericardial  space.  We then passed a 5 French pigtail catheter over the guide-wire,  removed the  guide-wire.  Hand and suction bottle aspiration was done.  This  was productive of a total of 550 to 600 mL of serosanguineous non-clotting  pericardial fluid.  The fluid was sent for protein, glucose, LDH, cell  count, CBC, ANA, aerobic, and anaerobic CNA, LDH, and amylase.  The catheter  was manipulated under fluoroscopic control until there was no further  pericardial fluid able to be aspirated.  The guide-wire was inserted  carefully and the pigtail catheter was then removed.  Prior to pigtail being  removed, post pericardial centesis pressures were obtained.   RA 9/11, mean 8 mmHg.  RV 34/6, RVEDP 10 mmHg.  PA 34/21, mean 26 mmHg.  PCW 16/19, mean 18 mmHg.   The patient developed mild sinus tachycardia and her blood pressure  increased from a baseline systolic of approximately 130 to 135 to 140 to 145  systolic.  She was given 5 mg Lopressor and her blood pressure came down to  138 systolic and heart rate 97.  Ellin Goodie then did a 2D echo after  pericardial centesis and this revealed only minimal lateral pericardial  effusion remaining.  Systolic ventricular function was approximately 40-45%  EF.  There was no right atrial collapse or diastolic RV collapse with good  RV function.  Fluoroscopy did not show any pneumothorax.  The patient was  pain free at this time and stable.  We left the femoral venous sheath in  place and she did not have IV access so she could be watched post procedure.  She tolerated the procedure well and was transferred to the CCU for  postoperative care in stable condition.   FINAL DIAGNOSIS:  1.  Successful percutaneous pericardial centesis, no evidence of tamponade      physiology pre or post pericardial centesis.  Fluid productive of      serosanguineous non-clotting fluid.  2.  History of SLE with renal failure on chronic hemodialysis.  3.  Chronic anemia secondary to hyperparathyroidism. 4.  Mild nonischemic cardiomyopathy with current EF 40-45%  post pericardial      centesis and minimal residual effusion.       RAW/MEDQ  D:  03/30/2004  T:  03/30/2004  Job:  [id number]   cc:   Fayrene Fearing L. Deterding, M.D.  [address]  [place]  [place] [address]  Fax: [phone]   Darlin Priestly, MD  [phone] [address], [address]  [place]  [place] [address]  Fax: [phone]

## 2010-10-22 NOTE — Op Note (Signed)
NAME:  [name] [name], [name] [name]                       ACCOUNT NO.:  [id number]   MEDICAL RECORD NO.:  [id number]                   PATIENT TYPE:  OIB   LOCATION:  2550                                 FACILITY:  [facility]   PHYSICIAN:  Anselm Pancoast. Zachery Dakins, M.D.          DATE OF BIRTH:  1976-04-07   DATE OF PROCEDURE:  12/09/2003  DATE OF DISCHARGE:  12/09/2003                                 OPERATIVE REPORT   PREOPERATIVE DIAGNOSIS:  Bleeding, recent placement of CAPD catheter and  bled while on hemodialysis today.   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   PROCEDURE:  Exploration of abdominal wounds, flushing of the catheter,  culture of the wounds, and reclosure.   ANESTHESIA:  General anesthesia.   SURGEON:  Anselm Pancoast. Zachery Dakins, M.D.   INDICATIONS FOR PROCEDURE:  The patient is [name] who had [name]  CAPD catheter placed through [name] small right lower quadrant incision and [name]  small umbilical hernia done yesterday and she was released afterwards.  She  stated that probably by 11 o'clock she noticed there was [name] little bit of  blood on the dressing.  I think she called and somebody assured her that it  was okay.  Then this morning, she went for hemodialysis, and during  hemodialysis, she started having significant bleeding of the abdominal  incision.  I had talked with Elnita Maxwell prior to the hemodialysis and whether  she saw Elnita Maxwell before or after the hemodialysis, I am not sure, but then she  was sent to our office where she had [name] large hematoma with probably [name] unit  of blood in the dressing, running all down the legs, etc.  I sent her over  here for coag studies, CBC, and OR for wound exploration.  She had her coag  studies drawn about three hours after she came off hemodialysis and her PTT  was still 74 seconds and supposedly she was dialyzed without heparin.  She  has never had [name]  coagulopathy before, but she has bled after [name] kidney biopsy  before, so there may be some form of [name] clotting problem.  The patient was  given 1 gram of Kefzol and is now approximately 8 o'clock when we finally  got to the OR and she came off hemodialysis at approximately 3:30.   DESCRIPTION OF PROCEDURE:  The patient was positioned on the OR table and  induction of general anesthesia and endotracheal tube.  We held the catheter  up and then prepped the abdomen and catheter with Betadine surgical scrub  and Betadine solution.  I then cut off the catheter, disconnecting the  extension tube, etc., and she was draped in [name] sterile manner.  I had  occluded the catheter with [name] hemostat while this was being done, all  sterilely.  First we flushed the catheter.  Unfortunately, there was very  little blood  in the peritoneal cavity.  The little fluid that returned was  pinkish and not blood.  Then I probably put 100 mL of saline in and kind of  aspirated this and this was just kind of thin bloody stain type fluid and  not intra-abdominal bleeding.  I did send [name] culture of the peritoneal fluid  for culture, but it certainly looks like just [name] small amount of blood and  not that of an infection.  Next, she had been draped in [name] sterile manner.  We removed the sutures and at the abdominal area, fortunately most of the  blood had come out of the abdominal Mcnicholas.  There was probably an ounce or  two of blood in both the abdominal incisions.  She is very thin and this was  washed out.  I really could not see any significant bleeding site, but we  frequently do not.  We then cultured both wounds aerobically and then  flushed them out continuously.  First she had swollen at the umbilicus and  it looked as if we were getting some ischemia on the skin edges and I did  debride the superior flap so that we can kind of close the incision with  good viable tissue.  I found [name] few little areas at the left lateral  aspect  of the umbilical area that may have been [name] bleeding site and this was  sutured with 4-0 Vicryl and there was nothing actually hemorrhaging and the  CAPD catheter wound I flushed out around the catheter, and could not really  find any definite bleeding site on that and then reclosed the wounds.  I did  open the anterior rectus fascia and the muscle fibers and there was  fortunately not [name] significant hematoma in the abdominal muscle Storts and this  was reclosed with 4-0 Vicryl and then fascia anterior closed with 2-0  Prolene as it had been done previously.  The subcutaneous wounds were closed  with 4-0 Vicryl where I usually use 3-0 chromic and then the skin was closed  with interrupted sutures of simple 4-0 nylon.  Antibiotic ointment was  placed and [name] sterile occlusive dressing.  We will flush her peritoneal  cavity in the morning and several times tomorrow to hopefully cleanse her.  We will keep her on Kefzol and then we will wait and if she can wait until  Friday which is her usual hemodialysis day, we will dialyze her here and  then discharge her afterward and we will probably keep her on Kefzol  awaiting the results of the cultures and hopefully we can get this to heal  without having an infected catheter.   We will get electrolytes and [name] CBC in the recovery room.  We do have to give  her about 750 mL of fluid intraoperatively because of tachycardia and mild  hypertension and I think she has had previously problems with blood  pressures.                                               Anselm Pancoast. Zachery Dakins, M.D.    WJW/MEDQ  D:  12/10/2003  T:  12/11/2003  Job:  [id number]   cc:   Dyke Maes, M.D.  [address]  [place]  [place] [address]  Fax: [phone]

## 2010-10-22 NOTE — Discharge Summary (Signed)
NAME:  [name] [name], [name] [name]             ACCOUNT NO.:  [id number]   MEDICAL RECORD NO.:  [id number]          PATIENT TYPE:  INP   LOCATION:  2921                         FACILITY:  [facility]   PHYSICIAN:  Zenovia Jordan, P.A.   DATE OF BIRTH:  11/08/1975   DATE OF ADMISSION:  03/29/2004  DATE OF DISCHARGE:  04/02/2004                                 DISCHARGE SUMMARY   ADMISSION DIAGNOSIS:  1.  Uremic pericarditis with large pericardial effusion.  2.  End stage renal disease on CAPD.  3.  Hypertension with cardiomyopathy.  4.  Iron deficiency anemia.  5.  Systemic lupus erythematosus.   DISCHARGE DIAGNOSIS:  1.  Uremic pericarditis with large pericardial effusion status post      pericardiocentesis, Dr. Alanda Amass, October 25.  2.  Mild nonischemic cardiomyopathy with ejection fraction estimated at 40%.  3.  End stage renal disease off CAPD on chronic hemodialysis.  4.  Status post removal of peritoneal dialysis catheter, Dr. Zachery Dakins,      October 26.  5.  Status post PermCath placement, Dr. Fayrene Fearing Deterding, October 24.  6.  Paroxysmal atrial flutter spontaneously converted to sinus rhythm on      beta blocker.  7.  Systemic lupus erythematosus.  8.  Iron deficiency anemia.   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 Darrick Penna.  Following placement of  the catheter with follow up chest x-ray, she was found to have a large  pericardial effusion.  She was asymptomatic, however, admitted for workup.   LABORATORY DATA:  Labs on admission revealed white count 5,900, hemoglobin  11.5, hematocrit 35.9, platelets 208,000.  Sodium 137, potassium 5.5,  chloride 103, CO2 25, BUN 42, creatinine 14.6, glucose 85, calcium 7.5.  Chest x-ray shows cardiomegaly, pulmonary vascular  congestion, no  pneumothorax following right central venous catheter placement.   HOSPITAL COURSE:  The patient was admitted and underwent dialysis.  Cardiology consult was obtained and Dr. Alanda Amass proceeded with a  successful percutaneous pericardial centesis on October 25.  There was no  evidence of tamponade physiology pre or post pericardial centesis.  The  fluid was productive of serosanguineous non-cloudy fluid.  Cardiac  catheterization also showed mild nonischemic cardiomyopathy with current  ejection fraction of 40-45% post pericardial centesis with minimal residual  effusion seen.  She tolerated the procedure well.  She was dialyzed on  consecutive days.  Her hypertension resolved and her dry weight was  estimated at 47.5 kilograms.  Her PD catheter was removed on October 26 by  Dr. Zachery Dakins.  She had brief moment of atrial flutter on telemetry but  resolved spontaneously to sinus rhythm.  She was, nevertheless, started on  Metoprolol 25 mg b.i.d.  Arrangements were made for outpatient dialysis  Monday, Wednesday, and Friday at the [facility].  EPO was  started at 15,000 units IV each dialysis.  InFeD was dosed at 50 mg  IV every  Wednesday.  Her hemoglobin was approximately 8.8 at discharge.  Her iron  saturation level was 61%.  Pericardial fluid was sent off for analysis and  it showed 3.7 mg of protein, 78 mg glucose, 10.9 mg creatinine, 205 LDH, and  amylase 123.  Cultures were negative.  The patient's TSH was within normal  limits at 2.8.  Pathology report on the pericardial fluid showed scattered  reactive mesothelial cells and predominantly chronic inflammatory cells.   DISCHARGE MEDICATIONS:  EPO 15,000 units IV each dialysis, InFeD 50 mg IV  every Wednesday, prednisone 5 mg daily, Nephrovite vitamins one daily,  PhosRenal 500 mg 1 with meals b.i.d., PhosLo 667 mg three with each meal,  Metoprolol 25 mg b.i.d.      RRK/MEDQ  D:  07/02/2004  T:   07/03/2004  Job:  [id number]   cc:   Gerlene Burdock A. Alanda Amass, M.D.  [phone] [address], [address]  [place]  [place] [address]  Fax: [phone]   Anselm Pancoast. Zachery Dakins, M.D.  [address]  [place]  [place] [address]   [facility]

## 2010-10-22 NOTE — Op Note (Signed)
NAME:  [name] [name], [name] [name]             ACCOUNT NO.:  [id number]   MEDICAL RECORD NO.:  [id number]          PATIENT TYPE:  OIB   LOCATION:  2899                         FACILITY:  [facility]   PHYSICIAN:  James L. Deterding, M.D.DATE OF BIRTH:  Oct 30, 1975   DATE OF PROCEDURE:  03/29/2004  DATE OF DISCHARGE:                                 OPERATIVE REPORT   PREOPERATIVE DIAGNOSIS:   POSTOPERATIVE DIAGNOSIS:   OPERATION PERFORMED:  Diatek PermaCath insertion.   SURGEON:  James L. Deterding, M.D.   ANESTHESIA:   INDICATIONS FOR PROCEDURE:  Access for hemodialysis.   The procedure was explained to the patient and both benefits and risks  understood and accepted.   DESCRIPTION OF PROCEDURE:  The patient was taken to the fluoroscopy suite  and placed on the fluoroscopy table in supine position with a towel roll  between her shoulders.  Initially, the internal jugular vein was located  with Site-Rite ultrasound device to ensure its patency.  Above the clavicle  appeared to be patent.  The right side of the neck and right upper chest  were prepped with Betadine.  2% Xylocaine local anesthesia was used to numb  up the area over the sternocleidomastoid triangle and in inferolateral  fashion approximately 8 x 4 cm.  Using the Site-Rite ultrasound device, the  internal jugular vein was cannulated, quite easily but a guidewire would not  go beyond the subclavian, internal jugular junction.  Subsequently, the  right subclavian was cannulated and a J-tip guidewire advanced through the  thin 18 gauge thin-walled needle into the subclavian vein, superior vena  cava and inferior vena cava under fluoroscopic guidance.  The opening where  the guidewire went through the skin was dilated with sharp dissection  approximately 1 cm.  Serial dilators were placed over the guidewire then  starting with 10 [place] and going through 12 and 14 [place] to lessen  resistance in the skin and subcutaneous tissue and  vessel Kullman.  Subsequently 16 French trocar and tear-away sheath was placed over the  guidewire and advanced into the subclavian vein to the junction of the left  subclavian and the superior vena cava and the sheath then advanced.  Both  lumens of a 24 cm Diatek PermaCath were flushed with saline and the distal  end clamped.  The trocar was then withdrawn from the sheath and the sheath  advanced and the sheath then clamped with a dialysis clamp.  The proximal  end of the Diatek PermaCath was advanced through the distal end of the  sheath with the venous limb being inferior and subsequently in the chest  lateral.  The sheath was then clamped, the catheter was advanced through the  remainder of the sheath into the subclavian vein and superior vena cava and  as the sheath was withdrawn, the catheter advanced.  The sheath was  completely removed and the catheter advanced to the tips of the superior  vena cava right atrial junction.  A 0.5 cm stab wound was placed  approximately 7 cm inferolateral to the vein entry.  Through the distal stab  wound, the blunt  tunneling device was placed in the subcutaneous tissue and  advanced in a curvilinear manner in superior and medial fashion up to and  through the vein entry opening.  Subsequently, the distal end of the  catheter was attached to the proximal end of the tunneling device and pulled  through the tunnel with Dacron cuff approximately 4 cm from the tunnel exit  site.  The position of the cath was confirmed once again.  The catheter was  then clamped, cut off and a friction screw on connecting device placed on  the distal end to facilitate attachment to the dialysis machine.  This was  secured.  The catheter was flushed with saline and then heparin locked with  concentrated heparin.  The skin over the vein entry was closed with 3-0 and  the two ends of the catheter were secured with 3-0 silk also.  HypaFix  dressing was applied.   FINDINGS:  A  very large pericardial effusion.  The patient is to be  admitted.       JLD/MEDQ  D:  03/29/2004  T:  03/29/2004  Job:  [id number]

## 2010-10-22 NOTE — Op Note (Signed)
NAME:  [name] [name] [name], [name] [name] [name]                       ACCOUNT NO.:  [id number]   MEDICAL RECORD NO.:  [id number]                   PATIENT TYPE:  OIB   LOCATION:  2899                                 FACILITY:  [facility]   PHYSICIAN:  Anselm Pancoast. Zachery Dakins, M.D.          DATE OF BIRTH:  [birth year]   DATE OF PROCEDURE:  12/09/2003  DATE OF DISCHARGE:                                 OPERATIVE REPORT   PREOPERATIVE DIAGNOSIS:  1. Chronic renal failure, desires chronic ambulatory peritoneal dialysis.  2. Small umbilical hernia.   POSTOPERATIVE DIAGNOSIS:  1. Chronic renal failure, desires chronic ambulatory peritoneal dialysis.  2. Small umbilical hernia.   OPERATION PERFORMED:  Repair of umbilical hernia and placement of chronic  ambulatory peritoneal dialysis catheter under general anesthesia.   SURGEON:  Anselm Pancoast. Zachery Dakins, M.D.   ANESTHESIA:   INDICATIONS FOR PROCEDURE:  [name] [name] [name] is [name] [name] victim of end-  stage renal disease who is on hemodialysis Monday, Wednesday and Friday and  she desires to switch to CAPD.  She has [name] [name] and has been on  steroids which have been gradually tapered.  I saw her approximately three  months ago at which time she was on prednisone 20 mg [name] [name] day and we elected to  wait until the steroids had been down to [name] [name] very minimal dose before  proceeding with catheter placement.  She has [name] [name] that  we will repair simultaneously.  Her steroids have been tapered.  She is now  on 5 mg every other day and I think we can safely proceed on with catheter  replacement.  Dyke Maes, M.D. is her nephrologist.   DESCRIPTION OF PROCEDURE:  The patient was taken to the operative suite.  She had been given [name] [name] and after induction of general  anesthesia, the abdomen was prepped with Betadine surgical scrubbing  solution and draped in [name] [name] sterile manner. [name] [name] small umbilical incision below  the umbilicus was made.  The  skin was elevated from the little hernia sac  and then the skin was separated carefully not to buttonhole the skin and  then the underlying fascia was freed up circumferentially.  I closed the  actual peritoneum with 2-0 Vicryl and then closed the  fascia with  interrupted single sutures of 2-0 Prolene and then the incision was closed  transversely.  I left the skin open until I proceeded with the CAPD catheter  placement making [name] [name] over the  anterior rectus and then the anterior rectus fascia was opened vertically  about an inch and then the underlying rectus muscle split in the direction  of the fibers.  The posterior rectus fascia and peritoneum was identified,  carefully picked up and [name] [name] made through both layers.  Three  hemostats were placed and then I placed an inner suture of 2-0  circumferentially  and then [name] [name] of 2-0 Vicryl and  then I placed my finger up to kind of feel the fascia making sure that we  had not caught any loops of small intestine and this was all free and it  appears that the fascial defect is intact.  Next, I placed [name] [name] over [name] [name] guidewire into the lower abdomen and tied the Vicryl  pursestring suture so that the Silastic ball was in the peritoneal cavity  and the internal cuff was lying flat on the posterior rectus fascia.  We  then positioned the catheter so it was in the lower abdomen and then the  rectus muscle was approximated with two sutures of 3-0 chromic.  This is  getting the catheter going obliquely through the abdominal Strausser and then the  anterior rectus fascia was closed with interrupted sutures of 2-0 Prolene.  I had anesthetized all the layers as we were closing with Marcaine with  Adrenalin and I had also put this in the umbilical hernia repair.  Next, the  catheter was tunneled subcutaneously to exit the right lower quadrant and  the external cuff  was coming out right at the dermis or actually at the  dermis area but not being exposed.  The catheter was then flushed.  It  returned clear and I then closed the subcutaneous tissue of both wounds with  3-0 chromic and then the skin was closed with interrupted sutures of 4-0  nylon.  Antibiotic ointment was placed on all three incisions and around the  exit site and then held in place securely with HypaFix over 4 x 4s.  I had  already attached the connector and the extension tubing and capped it off  and the patient discussed that she would like to go home.  In that she is  doing fine in the recovery room, I think she can.  She will have  hemodialysis tomorrow at [facility] and can be seen by Elnita Maxwell for flushing  the catheter even tomorrow or Friday.  We are planning to wait probably  about three weeks before she actually starts on CAPD.  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                                               Anselm Pancoast. Zachery Dakins, M.D.    WJW/MEDQ  D:  12/09/2003  T:  12/09/2003  Job:  [id number]   cc:   Dyke Maes, M.D.  [address]  [place]  [place] [address]  Fax: [phone]

## 2010-10-22 NOTE — Procedures (Signed)
CONSULTING PHYSICIAN:  Pramod P. Pearlean Brownie, M.D.   REFERRING PHYSICIAN:  Dyke Maes, M.D.   CLINICAL HISTORY:  A 35 year old [place] American lady with an episode of  possible seizure activity.   MEDICATIONS:  Prednisolone, Nephro-Vite, and vitamin D.   This is a routine 17-channel EEG performed during wakeful and drowsy states  using standard 10/20 electrode placement.  Background awake rhythm consists  of 7 to 8 hertz alpha which is a moderate amplitude, reactive, and  synchronized in both hemispheres.  Intermittent 6 to 7 hertz __________  slowing is seen bilaterally in generalized and symmetric distribution.  Red  isolated left temporal sharp activity seen.  No paroxysmal epileptiform  activity or spikes are seen.  Stages of light sleep were achieved naturally  in this tracing and show no significant abnormalities.  Hyperventilation  photic stimulation result in no significant abnormalities. Length of the  tracing was 24.9 minutes.  Technical component is adequate.  EKG tracing  reveals a regular sinus rhythm.   IMPRESSION:  This electroencephalogram performed in a wakeful state and  light sleep is abnormal due to the presence of mild generalized slowing and  focal left temporal cortical __________  .  No definitive epileptiform  activity are identified.      EAV:WUJW  D:  03/19/2004 18:41:23  T:  03/19/2004 22:35:56  Job #:  [id number]   cc:   Dyke Maes, M.D.  [address]  [place]  [place] [address]  Fax: [phone]

## 2010-10-22 NOTE — Op Note (Signed)
NAME:  [name] [name], [name] [name]             ACCOUNT NO.:  [id number]   MEDICAL RECORD NO.:  [id number]          PATIENT TYPE:  INP   LOCATION:  2921                         FACILITY:  [facility]   PHYSICIAN:  Anselm Pancoast. Weatherly, M.D.DATE OF BIRTH:  1976-02-04   DATE OF PROCEDURE:  03/31/2004  DATE OF DISCHARGE:                                 OPERATIVE REPORT   PREOPERATIVE DIAGNOSIS:  Nonused chronic ambulatory peritoneal dialysis  catheter removal, double cuffed.   POSTOPERATIVE DIAGNOSIS:   OPERATION PERFORMED:   SURGEON:  Anselm Pancoast. Zachery Dakins, M.D.   ASSISTANT:  Nurse.   ANESTHESIA:  General.   INDICATIONS FOR PROCEDURE:  Ms. Ellithorpe is a 35 year old female who was started  on peritoneal dialysis just this past spring.  It appeared that the catheter  was functioning satisfactorily but there was a question of whether the  patient was doing her exchanges as she has had problems with uremia and was  admitted on Dr. Deterding's service on March 29, 2004.  We have not had an  obvious infection possibly and exit site problem and she has now got an Ash  catheter has started on hemodialysis  and decision basically stopped any  attempt at peritoneal dialysis been made by the renal doctors.  I was asked  to remove her catheter and she was hemodialyzed yesterday.  She is on  vancomycin.  No additional medications were used.   DESCRIPTION OF PROCEDURE:  Induction of general anesthesia.  She has a thin  abdomen and the abdomen was prepped with Betadine surgical scrub and  solution and draped in a sterile manner.  A small incision was made opening  up the old incision where it was originally placed.  The catheter identified  in the subcutaneous tissue going into the right rectus and I grasped the  catheter, divided it and then used the internal portion as a handle.  Cautery was used to free up the rectus muscle from around the internal cuff  and the cuff was separated from the posterior rectus  fascia and the  peritoneum.  I then opened into the peritoneal.  The fluid does not look  infected and I removed the catheter, cultured the fluid aerobically and then  closed the peritoneum and posterior rectus fascia transversely with a  running 2-0 Vicryl.  2-0 Vicryl was used to basically approximate the rectus  muscle and then the anterior rectus fascia was closed with interrupted 2-0  Prolene.  The patient bled postoperatively originally and I tried to be very  meticulous suturing anything of question but it was still kind of a little  oozy.  The subcutaneous tissue was then closed with 3-0 chromic and the skin  was closed with mattress sutures of 5-0 nylon.  Next the external cuff was  freed.  There was a little chronic granulation inferiorly that was excised  and then the subcutaneous tissue was removed around the external cuff.  I  closed the subcutaneous tissue with 3-0 chromic and then closed the skin  with  interrupted 5-0 nylon simple sutures, transversely.  Antibiotic ointment was  applied and then a  light pressure dressing applied.  The patient tolerated  the procedure well and should be able to eat this afternoon.  I think her  next hemodialysis is scheduled for tomorrow.  She will need her stitches  removed in approximately 10 to 14 days.       WJW/MEDQ  D:  03/31/2004  T:  03/31/2004  Job:  [id number]   cc:   Fayrene Fearing L. Deterding, M.D.  [address]  [place]  [place] [address]  Fax: [phone]

## 2010-10-22 NOTE — Op Note (Signed)
NAME:  [name], [name]             ACCOUNT NO.:  [id number]   MEDICAL RECORD NO.:  [id number]          PATIENT TYPE:  INP   LOCATION:  5507                         FACILITY:  [facility]   PHYSICIAN:  James L. Deterding, M.D.DATE OF BIRTH:  02/08/1976   DATE OF PROCEDURE:  DATE OF DISCHARGE:                                 OPERATIVE REPORT   PROCEDURE:  Diatek PermCath insertion.   INDICATIONS:  Access for hemodialysis.   The procedure was explained to the patient and both benefits and risks  understood and accepted.  The patient was taken to the fluoroscopy suite,  placed on the fluoroscopy table in a supine position with a towel roll  between her shoulders.  Initially the right side of the neck and right upper  chest were evaluated with a Site-Rite ultrasound to locate the position and  patency of the right internal jugular vein.  Subsequently the right side of  the neck and right upper chest were prepped with Betadine.  Xylocaine 2%  local anesthesia was used to numb up the area over the sternocleidomastoid  triangle and in an inferolateral fashion approximately 4 cm.  Using the Site-  Rite ultrasound device in a sterile manner, an 18-gauge thin-Rovira needle was  inserted into the internal jugular vein, a J-tip wire passed through that 18-  gauge needle into the internal jugular vein, superior vena cava and inferior  vena cava under fluoroscopic guidance.  The 18-gauge thin-Vandermeulen needle was  withdrawn and the opening where the guidewire went through the skin was  dilated with sharp dissection approximately 0.5 cm.  Serial dilators were  placed over the guidewire, starting at 10 [place], then going through 12 and  14, 16 [place] under fluoroscopic guidance to lessen resistance in the skin  and subcutaneous tissue and vessel Purdie, and subsequently a 16 French trocar  and tear-away sheath were placed over the guidewire into the internal  jugular vein and superior vena cava under fluoroscopic  guidance.  A  nonserrated dialysis clamp was placed around the trocar and sheath just  outside the skin.  A double-lumen 24 cm Diatek PermCath was flushed with  saline and the distal end clamped.  The trocar was withdrawn through the  sheath, the sheath clamped with a dialysis clamped, and the proximal end of  the catheter started through the distal end of the sheath with the venous  lumen being lateral.  The sheath was unclamped, the catheter advanced  through the remainder of the sheath as the guidewire was withdrawn.  Position was confirmed and the tear-away sheath removed, position once again  confirmed.  A 7 cm curvilinear tunnel was mapped.  A 0.5 cm stab wound was  made 3 cm inferior to the clavicle and approximately 7 cm inferolateral to  the vein entry.  Through the distal stab wound blunt tunneling device was  then passed in a curvilinear manner up to and through the vein entry  opening.  Over the proximal end of the tunneling device, a tunnel dilator  was placed and advanced through the skin and subcutaneous tissue to lessen  resistance for  cuff entry.  Subsequently the proximal end of the tunneling  device was attached to the distal end of the catheter and the catheter  pulled through the tunnel with the Dacron cuff 4 cm from its __________ exit  site.  There were no kinks in the catheter and the catheter adjusted so the  venous limb was lateral and the tips were at the superior vena cava-right  atrial junction.  The catheter was then clamped, cut off, and a  friction screw-on connecting device applied.  The catheter was then flushed  with saline and concentrated heparin and heparin-locked.  The skin over the  vein entry was closed with 3-0 silk and the two wings of the catheter  secured with 3-0 silk also.  A Hypafix dressing applied.      JLD/MEDQ  D:  08/31/2004  T:  08/31/2004  Job:  [id number]   cc:   [facility], [address]

## 2010-10-22 NOTE — Consult Note (Signed)
NAME:  [name] [name], [name] [name]                       ACCOUNT NO.:  [id number]   MEDICAL RECORD NO.:  [id number]                   PATIENT TYPE:  INP   LOCATION:  3314                                 FACILITY:  [facility]   PHYSICIAN:  Garnetta Buddy, M.D.                DATE OF BIRTH:  1976/05/12   DATE OF CONSULTATION:  05/19/2003  DATE OF DISCHARGE:                                   CONSULTATION   REFERRING PHYSICIAN:  Dr. Darlin Priestly.   REASON FOR CONSULTATION:  Asked to evaluate patient by Dr. Jenne Campus.  The  patient is known to have systemic lupus erythematosus, renal insufficiency  and proteinuria.  She was seen by Dr. Garnetta Buddy in [facility] approximately 12 months ago but refused followup due to financial  constraints.  She was initially seen due to proteinuria during her pregnancy  and was followed by [facility] at [facility]  She  delivered twins at 28 weeks of gestation in February of 2004 but  unfortunately, lost one due to prematurity.  Since that time, she has been  seen by Dr. Jenne Campus and diagnosed with [name] pericardial effusion and treated  with prednisone.  She would not follow up with nephrology due to financial  constraints.   She presents to Dr. Mikey Bussing office today with worsening dyspnea since  Friday, May 16, 2003.  She is unable to walk and now she is more  dyspneic, unable to complete sentences.  Her echocardiogram was performed by  Dr. Jenne Campus which showed worsening decrease in LV function.  She admits to  cough x3 weeks as well as [name] three-day history of fever, chill and [name]  productive cough with green-yellow phlegm.   In the emergency room, she was found to be anemic with an increased  creatinine and increasing potassium.  She is hypoxic and hypocarbic with  bilateral pulmonary edema and cardiomegaly.  Her pulse is impressive at 126  and her blood pressure elevated at 190/120.  She has been administered  Kayexalate  bicarbonate one ampule, ordered blood transfusions and [name] V/Q scan  with non-contrast chest CT.   She admits to abdominal pain that she states is sharp, periumbilical and her  menses are irregular.  Last menstrual period was one and [name] half months ago.  She is married and she is having unprotected intercourse.   She also complains of retrosternal sharp chest pains that radiated through  to her lower back.  She had been taking 8 to 10 Excedrins today.   PAST MEDICAL HISTORY:  1. Systemic lupus erythematosus.  2. Hypertension.  3. Pericardial effusion.  4. Nephrotic-range proteinuria.  5. Anemia.   MEDICATIONS:  Excedrin p.r.n.   SOCIAL HISTORY:  Married, two children, [name] Engineer, site, no tobacco, no  alcohol.   FAMILY HISTORY:  Mother:  Hypertension.   ALLERGIES:  No known drug allergies.   REVIEW OF SYSTEMS:  GENERAL:  Positive for fever and chills x3 days.  EYES:  No visual complaints.  EARS/NOSE/THROAT:  No hearing loss, no runny nose, no  sore throat.  CARDIOVASCULAR:  She states she has retrosternal pain that  radiates through to the back.  She denies orthopnea or PND.  She states she  has been increasingly dyspneic over the past several days; her exercise  tolerance was good before she became acutely ill.  RESPIRATORY SYSTEM:  She  admits to cough, productive phlegm, fever, chills.  She denies any pleuritic  chest pain.  ABDOMINAL SYSTEM:  She complains of sharp pains in her abdomen  but not any associated with nausea, vomiting or diarrhea; these are  intermittent.  GYN:  She states that menses are irregular and she last had [name]  period of one and [name] half months ago.  She does not use contraception.  DERMATOLOGIC:  She states she had [name] facial rash.  UROGENITAL:  She denies  any history of dysuria, frequency or urgency.  She denies any ankle swelling  or history of nephrolithiasis.  NEUROLOGIC:  She denies any history of  stroke or seizures.  No headache.  No double vision,  blurred vision,  difficulty with speech and swallowing.  No numbness, tingling or pins and  needles in her upper and lower extremities.   PHYSICAL EXAMINATION:  GENERAL:  Alert and oriented, appropriate and  comfortable.  VITAL SIGNS:  Blood pressure 170/120, pulse of 120, temperature 98.1, room  air saturations of 98%.  HEENT:  Head and eyes:  Normocephalic, atraumatic.  Pupils round and equal,  reactive.  Ears, nose, mouth and throat:  External appearance normal.  Oropharynx was clear.  NECK:  Neck was supple with no thyromegaly, no adenopathy.  JVD was mildly  elevated.  CARDIOVASCULAR:  She was tachycardic with an S3 summation gallop.  She had [name]  left ventricular heave.  RESPIRATORY SYSTEM:  Percussion note was resonant.  Breath sounds revealed  crackles to the mid-zones.  There were no wheezes.  ABDOMINAL SYSTEM:  Soft.  Divarication of the recti muscles was felt in her  abdominal Sandlin.  There was no hepatosplenomegaly.  Bowel sounds were  present.  RECTAL:  Rectal was deferred.  NEUROLOGIC:  Cranial nerves II-XII were intact.  Reflexes symmetric and  brisk.  Sensation was normal.  Pulses, right and left, were present,  dorsalis pedis 2+, posterior tibial 2+.   LABORATORIES:  Troponin 0.07, CPK 23.  Sodium 135, potassium 5.9, chloride  111, CO2 14, BUN 79, creatinine 13,3, glucose 86.  CBC:  White blood count  9.0, hemoglobin 6.6, platelets of 381,000.  Alkaline phosphatase 40, SGOT  67, SGPT 44, albumin 1.9, calcium 8.4.   ASSESSMENT AND PLAN:  1. Acute renal failure, history of systemic lupus erythematosus, increased     protein approximately one year ago, no history of biopsy but [name] history of     nonsteroidal anti-inflammatory drug use.  Etiology could be prerenal or     post-renal but the history of lupus with possible membranous     glomerulopathy raises the suspicion of renal vein thrombosis.  Renal    biopsy would be indicated, based on the urine sediment.  2.  Hypertension urgency.  Will add clonidine 0.3 mg STAT and Norvasc 10 mg     daily.  3. Pulmonary edema.  We will rule out pulmonary embolus.  We will obtain V/Q     scan in view of the history of nephrotic  syndrome.  4. Chest pain.  Rule out with enzymes.  Echocardiogram reveals no evidence     of pericardial effusion.  Would recommend CT scan without contrast in     order to rule out an aortic dissection.  5. Hyperkalemia.  We will treat with Kayexalate 60 g STAT and bicarbonate     intravenously.  6. Non-anion gap metabolic acidosis.  We will treat with intravenous     bicarbonate.  7. Anemia.  Will need transfusion.  8. Will need dialysis after radiological evaluation has been performed.                                               Garnetta Buddy, M.D.   MWW/MEDQ  D:  05/19/2003  T:  05/20/2003  Job:  [id number]

## 2010-10-22 NOTE — H&P (Signed)
[facility]  Patient:    [name] [name], [name] [name]                 MRN: [id number] Adm. Date:  [id number] Attending:  Tonye Royalty Dictator:   Gaetano Hawthorne. Lily Peer, M.D.                         History and Physical  OBSERVATION NOTE  HISTORY OF PRESENT ILLNESS:   The patient is a 35 year old gravida 2, para 0, AB 1, currently 40-2/7ths weeks gestation, who was admitted early this morning through maternity admission at approximately 1:30 in the morning complaining of uterine contractions.  She was placed on the monitor; her contractions were every 6-7 minutes apart with a reactive tracing and the nurse who examined her thought the patient was 2 cm, 70% effaced, vertex, -2 station.  She was placed on observation in a labor and delivery suite.  Her contractions were still not frequent and irregular.  Fetal heart rate tracing was reassuring.  PHYSICAL EXAMINATION:  VITAL SIGNS:                  Blood pressure 112/68, pulse 88, respiration 20, temperature 97.  PELVIC:                       My pelvic examination demonstrated the cervix to be fingertip, 50% effaced, ballotable, reactive fetal heart rate tracing.  ASSESSMENT AND PLAN:          The patient was released home with labor precautions.  The patients prenatal course had essentially been unremarkable. She does have a history of laser of her cervix for dysplasia in the past.  The patient to return to the office next week for her routine prenatal visit unless she goes into true labor or has any rupture of membranes or any questions, to report to the emergency room here at [facility].  All questions were followed and will following accordingly. DD:  01/29/00 TD:  01/29/00 Job: [id number]

## 2010-10-22 NOTE — H&P (Signed)
NAME:  [name], [name]             ACCOUNT NO.:  [id number]   MEDICAL RECORD NO.:  [id number]          PATIENT TYPE:  OIB   LOCATION:  5511                         FACILITY:  [facility]   PHYSICIAN:  James L. Deterding, M.D.DATE OF BIRTH:  02-28-1976   DATE OF ADMISSION:  03/29/2004  DATE OF DISCHARGE:                                HISTORY & PHYSICAL   REASON FOR ADMISSION:  Pericardial effusion.   HISTORY OF PRESENT ILLNESS:  [name] [name] is a 35 year old African-American  female with a past medical history of end-stage renal disease, most recently  on peritoneal dialysis with previous hemodialysis starting in December 2004,  SLE, and previous pericardial effusion in December 2004.  She presented to  short-stay this morning to be scheduled for a right tunnel Diatek catheter  placement by Dr. Darrick Penna.  Upon placement of the catheter, the patient was  found to have a large pericardial effusion by fluoroscopy.  She was  asymptomatic per short-stay RN on arrival this morning.   PAST MEDICAL HISTORY:  1.  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 Elvis Coil at [facility],.  2.  History of pericardial effusion in December 2004, followed with Dr.      Jenne Campus, cardiology.  3.  Hypertension.  4.  Secondary hypothyroidism.  5.  Cardiomyopathy.  6.  History of seizures.  7.  Iron-deficiency anemia.  8.  Noncompliance with peritoneal dialysis.   ALLERGIES:  ANCEF.   MEDICATIONS:  1.  Prednisone 5 mg 1 p.o. daily.  2.  Cipro 500 mg 1 p.o. daily.  3.  Nephro-Vite 1 p.o. daily.  4.  Caltrate.   FAMILY HISTORY:  Mother has known history of hypertension.   SOCIAL HISTORY:  The patient is currently married.  She has two children.  She is a Engineer, site for an elementary school.  She denies tobacco or  alcohol use.   REVIEW OF SYSTEMS:  HEAD:  The patient denies cephalgia or migraines.  EYES:  Denies changes in vision.  EARS:  Denies  discharge, hearing change,  tinnitus.  NOSE:  Denies chronic sinusitis.  MOUTH AND THROAT:  Denies  tenderness or lesions.  RESPIRATORY:  The patient denies chronic cough,  hemoptysis, or shortness of breath.  CARDIOVASCULAR;  The patient denies  chest pain or palpitations.  GASTROINTESTINAL:  The patient denies nausea,  vomiting, or bowel changes.  GENITOURINARY:  The patient denies hematuria,  frequency, or dysuria.   PHYSICAL EXAMINATION:  VITAL SIGNS:  Not yet recorded.  NECK:  Supple with positive JVD with collapse on inspiration.  The patient  is pulsus paradoxus at 8 to 10 mmHg.  MOUTH: The patient has wet mucous membranes. There are no lesions within the  oral cavity.  HEART:  The patient has a tachycardic rate and rhythm.  No rubs or murmurs  auscultated.  LUNGS:  Mild crackles bilateral bases.  No rhonchi or wheezes.  ABDOMEN:  Inspection reveals slight distention of abdomen.  It is soft with  active bowel sounds.  There is no hepatosplenomegaly noted, no  lesions  noted.  Right peritoneal dialysis catheter in place with no drainage.  EXTREMITIES:  Negative bilateral lower extremity edema.   LABORATORY AND X-RAY DATA:  WBC 5.9, hemoglobin 11.5, hematocrit 35.9,  platelets 208.  Sodium 137, potassium 5.5, chloride 103, CO2 25, BUN 42,  creatinine 14.6, glucose 85, calcium 7.5.   IMPRESSION:  1.  Pericardial effusion.  2.  End-stage renal disease.  3.  Hypertension with cardiomyopathy.  4.  Iron-deficiency anemia.   PLAN:  1.  Admit patient to telemetry bed.  2.  STAT echocardiogram to be done in radiology.  Results are currently      pending.  3.  We have consulted Dr. Jenne Campus, who is her cardiologist, to assist.  4.  We will plan for dialysis today and in the morning with removal of only      1.5 liters with each treatment as well as a low blood flow rate of 150      secondary to her noncompliance with peritoneal dialysis to avoid      dysequilibrium syndrome.  5.   Concerning her right peritoneal catheter, it is going to be a plan that      this will be removed at a later date once her current problems are taken      care of.       MY/MEDQ  D:  03/29/2004  T:  03/29/2004  Job:  [id number]

## 2010-10-22 NOTE — Discharge Summary (Signed)
[facility]  Patient:    [name] [name], [name] [name]                 MRN: [id number] Adm. Date:  [id number] Disc. Date: [id number] Attending:  Tonye Royalty Dictator:   Antony Contras, [facility]                           Discharge Summary  DISCHARGE DIAGNOSES:          1. Intrauterine pregnancy at 40 weeks.                               2. Spontaneous onset of labor.                               3. Cephalopelvic disproportion.  PROCEDURE:                    Low cervical transverse cesarean section with delivery of viable infant.  HISTORY OF PRESENT ILLNESS:   The patient is a 35 year old gravida 2, para 0-0-1-0 with an LMP of October 2001 and an [facility] of January 26, 2000 per sonogram.  Prenatal risk factor was a history of previous laser treatment to the cervix.  PRENATAL LABORATORY DATA:     Blood type A positive.  Antibody screen negative.  Sickle cell trait negative.  RPR, HBSAG and HIV nonreactive. Rubella immune.  MSAFP within normal limits.  GBS negative.  HOSPITAL COURSE:              The patient presented on January 31, 2000 with the onset of uterine contractions.  The cervix was 3-4 cm, 40% and -2 station. Labor progressed slowly to 6-7 cm, 90% and -1 station.  Pitocin was discontinued due to hyperstimulation.  Low cervical transverse cesarean section as performed by Dr. Farrel Gobble under spinal anesthesia secondary to CPD and possible malpresentation.  The patient was delivered of a Apgar 9 and 24 female infant weighing 6 pounds and 8 ounces.  Normal uterus, tubes and ovaries.  Her postpartum course was complicated by a low grade fever up to 100.5.  CBC showed hematocrit 28.8, hemoglobin 9.3, platelets 211.  The patient requested to go home on her second postoperative day.  She was discharged in satisfactory condition.  DISPOSITION:                  Follow up in the office in six weeks.  Continue prenatal vitamins and iron.  Motrin and Tylox for  pain. DD:  02/18/00 TD:  02/20/00 Job: [id number]

## 2010-10-22 NOTE — Discharge Summary (Signed)
NAME:  [name], [name]             ACCOUNT NO.:  [id number]   MEDICAL RECORD NO.:  [id number]          PATIENT TYPE:  INP   LOCATION:  5507                         FACILITY:  [facility]   PHYSICIAN:  Maree Krabbe, M.D.DATE OF BIRTH:  18-May-1976   DATE OF ADMISSION:  08/27/2004  DATE OF DISCHARGE:  09/01/2004                                 DISCHARGE SUMMARY   ADMISSION DIAGNOSES:  1.  Febrile illness, hypertension.  2.  Gallstones.  3.  End-stage renal disease on chronic hemodialysis.  4.  Elevated liver function tests.  5.  Lupus erythematosus.  6.  History of very immediate pericarditis status post pericardial window      October 2005.  7.  History of anemia of chronic disease.  8.  History of secondary hyperparathyroidism.   DISCHARGE DIAGNOSES:  1.  Staph aureus bacteremia secondary to hemodialysis from cath infection.  2.  Elevated liver function tests, improved at time of discharge.  3.  End-stage renal disease on chronic hemodialysis.  4.  Anemia of chronic disease secondary to #1.  5.  Secondary hyperparathyroidism.  6.  History of gallstones.   PROCEDURES:  1.  Removal of subclavian Diatek Perma-Cath August 28, 2004, Zenovia Jordan,      P.A.-C.  2.  Insertion of IJ hemodialysis Perma-Cath, Dr. Beryle Lathe, on August 31, 2004.   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  At hemodialysis the day of admission  temperature was 102 and blood cultures x2 were taken and vancomycin 1 g was  given.  Blood pressure dropped from 90s to 70s, usually it is in the 120s to  110.  She was very weak and was sent to the ER for evaluation.  Dr. Delano Metz evaluated the patient and noted that her temperature was 100.4,  blood pressure was 76/30, pulse 100, respirations were  16.  She was alert  and chronically ill, mildly toxic and weak.  No JVD was present.  Throat  was clear.  Neck showed no JVD, no nodes were appreciated.  Lungs were clear  to auscultation.  Cardiac was slightly tachy at 100 and regular S4 present.  No rub was appreciated.  Abdomen showed bowel sounds positive and within  normal limits.  Abdomen was soft, mildly epigastric tender, no rebound  appreciated.  No hepatosplenomegaly appreciated.  Extremities revealed no  pedal edema, no ulcers or gangrene.  Noted a right IJ Diatek had dry blood  scantly present at the exit site.  Neurologically she was alert and oriented  x3.  Dr. Arlean Hopping admitted the patient with her fever and hypotension to rule  out catheter sepsis.  Also with her abdomen, rule out acute cholecystitis.  An ultrasound did show a tiny gallstone present and he will get a HIDA scan  if GI symptoms develop.   END-STAGE RENAL DISEASE:  Continue hemodialysis Monday, Wednesday, Friday  through [place].  ELEVATED LIVER FUNCTION TESTS:  Noted one to two months as an outpatient.  Doubt this was present but hepatitis studies negative as an outpatient.  She  was scheduled for liver biopsy as an outpatient next week.   SYSTEMIC LUPUS ERYTHEMATOSUS:  Continue to follow for now.   UREMIC PERICARDITIS:  Prior window procedure on October 2005.  Will follow  hemodynamically at this time. May need echocardiogram  if continues to be  significantly hypotensive.   HOSPITAL COURSE:  PROBLEM #1 -  FEBRILE ILLNESS/STAPH AUREUS BACTEREMIA:  The patient's blood cultures did come back showing Staphylococcus aureus.  She was placed on IV antibiotics with pharmacy dosing vancomycin.  She also  was placed on IV Cipro.  Her right subclavian Diatek catheter had thick  blood at the exit site and because she continued to spike temperatures noted  on the August 28, 2004 temperature 102, it was decided that [facility] needed  to be removed by Fransisca Kaufmann  without difficulty on the August 28, 2004, with  the catheter pulling out the exit site without incision or debridement  needed.  Pus was cultured.  The discharge also grew out Staph aureus.  She  will continue vancomycin therapy at hospital stay.  Her Cipro was stopped  after cultures came back showing Staph aureus bacteremia sensitive to the  vancomycin and was resistant to penicillin.  Concerning this bacteremia, he  was stable for discharge on the September 01, 2004 and was to receive vancomycin  IV at dialysis, last dose being September 17, 2004.  Vancomycin levels will be  followed up at the kidney center.   Bacteremia again was thought to be due to the Perma-Cath which also grew out  Staph aureus resistant to penicillin.  White count on admission was 16.6 and  was down to 9.7 near discharge.   PROBLEM #2 -  ELEVATED LIVER FUNCTION TESTS:  Noted as an outpatient,  patient was set up for liver biopsy with [facility] in about a week because of  elevated LFTs.  Hepatitis studies checked and have been negative.  Admission  AST was 144, ALT was 84, total bilirubin was normal. ALP was normal.  AST  was down to 80, ALT was down to 47 near discharge.  Dr. Dorena Cookey, [facility], saw patient in consult, noted on August 31, 2004.  She had been worked up  by Dr. Evette Cristal recently as an outpatient for the elevated LFT.  As noted,  possibly did not have full access to a work-up but noted her ALT and AST  were improving since admission.  Alkaline phosphatase and bilirubin were  normal.  She had a negative hepatitis C antibody in the past.  She had  autoimmune smooth muscle antibody that he was aware of.  She was referred  for a percutaneous liver biopsy with suspicion of autoimmune hepatitis  related to her lupus.  Also was noted to have a gallstone which was  considered asymptomatic not related to her elevated LFTs.  She had gallbladder ultrasound that did show normal appearing  liver with border  enlarged  spleen about a month ago and a normal PIPIDA scan as an outpatient.  Her hepatobiliary scan done on August 25, 2004, here showed patent biliary  tree, enterogastric reflux of bile.  Noted Dr. 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  He was to  review the work-up from Dr. Evette Cristal.  Dr. Madilyn Fireman favored holding off liver  biopsies at the present time because of the current illness/sepsis.  The  LFTs improving and most likely potential etiology of self-limiting type  process would be autoimmune hepatitis for which he is already being treated  with; i.e., prednisone.  That would tend to suppress diagnostic histologic  findings.  She will be followed up as an outpatient after discharge in Dr.  Luan Moore office, as noted by Dr. Madilyn Fireman on September 01, 2004.   PROBLEM #3 -  END-STAGE RENAL DISEASE:  Patient continued hemodialysis on  her scheduled time after Perma-Cath was replaced.  As an outpatient this  needed follow-up of her blood cultures and when these are negative, need to  proceed with a graft.   PROBLEM #4 -  ANEMIA:  The patient had an admission hemoglobin of 11.8, it  dropped to 10.9.  Epogen was continued at 16,000 units IV q. Hemodialysis.  Will follow up as an outpatient her hemoglobin and iron studies.   PROBLEM #5 -  SECONDARY HYPERPARATHYROIDISM:  Patient was continued on  Hectorol at 2.0 mcg q. hemodialysis.  Noted she had a calcium of 8.7 with  albumin of 2.9 and phosphorus 3.1 near admission.  Discharge phosphorus was  5.4, calcium 8.2.  She was on PhosLo three each meal for phosphate binders.   DISCHARGE MEDICATIONS:  1.  Prednisone 10 mg daily.  2.  Nephro-Vite one daily.  3.  Phosphate 20 mg at bedtime.  4.  PhosLo three AC.  5.  Hectorol 2.0 mcg IV q. hemodialysis.  6.  Epogen 16,000 units IV q. dialysis.  7.  Vancomycin 500 mg IV q. hemodialysis, last dose being September 17, 2004,      with vancomycin levels to be checked at  [facility].   Follow-up at the [facility] on Monday, Wednesday, Friday  schedule.  Follow-up with Dr. Dorena Cookey and Dr. Luan Moore team as an  outpatient for elevated LFT work-up.   DIET:  Renal failure diet.  Limit all fluids to four cups each day.       DWZ/MEDQ  D:  11/08/2004  T:  11/08/2004  Job:  [id number]   cc:   Everardo All. Madilyn Fireman, M.D.  [address]  [place]  [place] [address]  Fax: [phone]

## 2010-10-22 NOTE — Consult Note (Signed)
NAME:  [name] [name], [name] [name]             ACCOUNT NO.:  [id number]   MEDICAL RECORD NO.:  [id number]          PATIENT TYPE:  INP   LOCATION:  5507                         FACILITY:  [facility]   PHYSICIAN:  John C. Madilyn Fireman, M.D.    DATE OF BIRTH:  [birth year]   DATE OF CONSULTATION:  08/31/2004  DATE OF DISCHARGE:                                   CONSULTATION   REASON FOR CONSULTATION:  Elevated liver function tests.   HISTORY OF PRESENT ILLNESS:  The patient is a 35 year old black female with  a history of end stage renal disease secondary to systemic lupus  erythematosus who was admitted with hypotension and fever.  She has been  worked up by Dr. Evette Cristal recently as an outpatient for elevated liver function  tests and had been scheduled for an outpatient liver biopsy when she was  admitted currently.  I do not have full access to her workup but she has had  an ALT of 125, AST 74 on admission, and today these had fallen to 80 and 47  respectively.  Her alkaline phosphatase and bilirubin were normal.  She has  had negative hepatitis B and C antibody in the past.  She has not had an  anti-smooth muscle antibody that I am aware of but she was referred for  pecutaneous liver biopsy with the suspicion of autoimmune hepatitis related  to her lupus.  I do not know the degree of elevations of her transaminases  previously.  She is also known to have gallstones which apparently are  considered asymptomatic and not related to her elevated liver function  tests.  She had a gallbladder ultrasound which showed a normal appearing  liver with a borderline enlarged spleen about a month ago and normal PAPIDA  span.   PAST MEDICAL HISTORY:  1.  Systemic lupus erythematosus.  2.  End stage renal disease secondary to SLE.  3.  History of pericardial effusion.  4.  Hypertension.  5.  Secondary hypothyroidism.  6.  Cardiomyopathy.  7.  Seizures.  8.  Iron deficiency anemia.   MEDICATIONS:  Prednisone 5 mg three  days a week, Protonix, Phenergan,  Vistaril, Lorazepam.   ALLERGIES:  Ancef.   PAST SURGICAL HISTORY:  Peritoneal dialysis catheter placed, history of  COPD, repair of umbilical hernia.   SOCIAL HISTORY:  The patient is currently married.  She has two children.  She is a Engineer, site at an elementary school.   PHYSICAL EXAMINATION:  GENERAL:  Very somnolent, difficult to arouse, no  acute distress, I did not examine her in detail today.  VITAL SIGNS:  The most recent vital signs were temperature 96.6, pulse 98,  respirations 20, blood pressure 115/85.   IMPRESSION:  Elevated liver function tests, duration and magnitude unclear.  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   PLAN:  1.  Will review all workup from Dr. Evette Cristal.  2.  Although the patient is scheduled for percutaneous liver biopsy, will      hold off on this right now pending the procedure for records and  possible spontaneous and on the current doses of prednisone.  Will      reassess when all this information is obtained.      JCH/MEDQ  D:  08/31/2004  T:  08/31/2004  Job:  [id number]   cc:   Fayrene Fearing L. Deterding, M.D.  [address]  [place]  [place] [address]  Fax: [phone]   Graylin Shiver, M.D.  [phone] [address]  [address]  [place], [place] [address]  Fax: [phone]

## 2010-10-22 NOTE — Discharge Summary (Signed)
NAME:  [name] [name] [name], [name] [name] [name]                       ACCOUNT NO.:  [id number]   MEDICAL RECORD NO.:  [id number]                   PATIENT TYPE:  INP   LOCATION:  3712                                 FACILITY:  [facility]   PHYSICIAN:  Alvin C. Lowell Guitar, M.D.               DATE OF BIRTH:  09/10/1975   DATE OF ADMISSION:  05/19/2003  DATE OF DISCHARGE:  05/27/2003                                 DISCHARGE SUMMARY   ADMITTING PHYSICIAN:  Darlin Priestly, M.D., transferred to [facility].  Lowell Guitar, M.D. on May 22, 2003.   ADMISSION DIAGNOSES:  1. Congestive heart failure.  2. Membranous lupus.  3. Hypertension.   DISCHARGE DIAGNOSES:  1. End-stage renal disease secondary to lupus.  2. Iron-deficiency anemia .  3. Secondary hyperparathyroidism.  4. Hypertriglyceridemia.  5. Gram-positive cocci bacteremia.  6. Cardiomyopathy.   CONSULTATIONS:  1. Garnetta Buddy, M.D. on May 19, 2003.  Consult due to renal     insufficiency and proteinuria.  2. May 19, 2004, Areatha Keas, M.D., consulted due to lupus.   PROCEDURES:  1. Chest x-ray due to pain revealed marked generalized enlargement of the     cardiopericardial silhouette with perihilar and bibasilar pulmonary     edematous changes.  2. May 19, 2003, Dr. Elvis Coil placed [name] [name] right femoral temporary     hemodialysis catheter.  3. May 19, 2003, transfusion of 2 units of packed red blood cells and     hemodialysis due to hemoglobin 6.6.  4. May 19, 2003, renal ultrasound showed hyperechoic enlarged kidney.  5. VQ scan on May 19, 2003, due to shortness of breath showed an     abnormal scan felt secondary to multiple patchy infiltrates instead of     pulmonary emboli.  6. May 23, 2003, MRI/MRA abdomen with and without revealed abnormal     kidneys with diffuse T2 hyper-signal and striated nephroglands consistent     with interstitial edema.  Can be seen with acute nephritis of various     etiologies such as  ATN, glomerulonephritis, lupus nephritis, and     pyelonephritis.  Patent renal arteries and renal veins, accessory renal     arteries bilaterally.  7. Right I-J Diatek PermCath placed by Dr. Charlett Nose on May 22, 2003.  8. On December 20, Dr. Dyke Maes performed [name] [name] renal biopsy,     retrieving two cores of renal tissue.  9. Hemodialysis x 6.   BRIEF HISTORY:  [name] [name] [name] is [name] [name] 35 year old black woman admitted by Dr.  Jenne Campus on May 19, 2003, from his office due to dyspnea on exertion,  but after arrival at [facility] ED was found to be hypokalemic, hypoxic,  hypertachypneic, and chest x-ray results are as above.  She was admitted for  transfusion.  Renal consult with 2-D echocardiogram.   For presenting physical examination, labs, and diagnostics, see admission  H&P.   HOSPITAL  COURSE:  #1.  END-STAGE RENAL DISEASE SECONDARY TO LUPUS:  For ultrasound, MRA, MRI  of abdomen, see above procedures.  [name] [name] [name], [name] [name] young black woman, was asked  to be seen by Dr. Hyman Hopes on May 19, 2003, due to renal insufficiency and  proteinuria.  Approximately one year ago, Dr. Hyman Hopes was asked to see her at  [facility] but refused to follow up due to financial constraints.  Initially seen for proteinuria during her pregnancy.  Was found to be high  risk and was followed at [facility].  Delivered twins at approximately 28  weeks but unfortunately lost one due to prematurity.  Since that time has  been seen by Dr. Jenne Campus and diagnosed with pericardial effusion.  She has  been treated with prednisone for lupus.  Echocardiogram done in Dr.  Mikey Bussing office on the day of admission showed worsening decreased  LV  function with an ejection fraction of about 25 to 30%, global akinesis with  mild mitral regurgitation, mild tricuspid regurgitation, with mild moderate  pulmonary hypertension.  On day of admission, Dr. Hyman Hopes, as stated above,  placed [name] [name] temporary catheter for immediate  hemodialysis which she tolerated  well except for [name] [name] few drops in blood pressure.  We were able to  ultrafiltrate almost 1.6 liters.  Total hemodialysis treatment equals 6.   #2.  IRON-DEFICIENCY ANEMIA :  Iron was found to be 15, TIBC, percent  saturation 7.  Reticulocyte count 1%, RBC 2.62, B12 1481, folate 9.3.  Due  to hemoglobin upon presentation of 6.6, she received 2 units of packed red  blood cells on day of admission during the first dialysis treatment.  Hemoglobin responded well by day #2 of hospitalization, up to 8, and on day  of discharge 8.3.  She was given Epogen 10,000 units with hemodialysis  treatment and given InFeD 250 mg each hemodialysis treatment starting on  December 21, and we continued this upon discharge, but I am unable at this  time to find initial hemodialysis orders.  Stool cards were requested and  ordered, but they were never received by the lab because I do not have  results in the lab section of this chart at the time of this dictation.   #3.  SECONDARY HYPERPARATHYROIDISM:  __________ PTH was found to be at  452.8.  She did not received vitamin D analog during hospitalization.  Her  calcium average was in the high 7's to low 8's.  Phosphorus was in the 4's.  Unfortunately on the day of discharge, it had increased to 7.2.  Again, I do  not have initial hemodialysis orders to say if she was discharged on vitamin  D.  From what I can tell from the orders, it does not look like she received  binders; however, upon discharge, she was told to take Tums 500 two tablets  with meals, one tablet  with snacks.  As usual custom, new patients receive  complete panel of blood work drawn first or second treatment as an  outpatient at dialysis unit.   #4.  HYPERTRIGLYCERIDEMIA:  Triglycerides were found to be at 277, LDL 105,  HDL 11.  Due to [name] [name] slight bump in liver enzymes, we did not treat with [name] [name] statin.  She was on [name] [name] statin prior to being admitted, but I did not   discharge her on one.  AST was 67, ALT 44.   #5.  GRAM-POSITIVE COCCI BACTEREMIA:  On May 21, 2003, blood cultures  were  obtained due to [name] [name] temperature of 100.7.  She received empiric  vancomycin loading dose x 1 and [name] [name] maintenance dose x 1.  However, this was  changed to Ancef when sensitivities returned 1 g IV x 1 due to coagulase-  negative staff.  On May 24, 2003, blood cultures x 2 were obtained  again due to [name] [name] temperature of 99.1.  These, however, were negative.   #6.  CARDIOMYOPATHY AND HYPERTENSION:  See above Hospital Course #1 for  echocardiogram results from Dr. Mikey Bussing office on day of admission.  She  remained on digoxin, and this dose was finally increased to 0.125 mg p.o.  only given on non-dialysis days.  She was maintained on Procardia XL 60 mg  p.o. q.h.s.  Her systolic blood pressure averaged in the 140s.  She was also  given Coreg 0.125 mg b.i.d.   #7.  SYSTEMIC LUPUS ERYTHEMATOSUS:  Solu-Medrol was initiated upon  admission, but this was discontinue, and she was started on prednisone 60 mg  p.o. daily.  Lupus anticoagulant PT TLA was at 57.4;  the following day it  was 66.4, haptoglobin 204, complement 353, complement C4 11.  Some of this  blood work was drawn on May 19, 2003, with Dr. Marland Mcalpine workup.  Rheumatoid factor 64.  Reflex ANA evaluation greater than 1:1280, ANA  qualitative positive, ANA pattern speckled, extractable nuclear antibody  SSAROLAB, IgG was 174, reference range 049.  Smith antibody 354.  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   DISPOSITION:   DISCHARGE MEDICATIONS:  1. Coreg 3.125 mg p.o. b.i.d.  She understands not to take morning dose on     dialysis days.  2. Prednisone 60 mg p.o. daily at noon.  3. Nephro-Vite 1 tablet p.o. daily.  4. Digoxin 0.125 mg p.o. on non-dialysis evenings only.  5. Procardia XL 60 mg p.o.  q.h.s.  6. Tums 500 mg 2 tablets with meals and 1 tablet with snacks.  7. Pain management:  May take Advil or Motrin 200 to 400 mg every 6 hours as     needed.  She understands not to take Tylenol due to bump in liver     enzymes.   ACTIVITY:  As tolerated.   DIET:  Renal 80/2/2, 1200 ml fluid restriction.   WOUND CARE:  The patient understands not to shower and to allow dialysis  personnel to change dressing on catheter.   SPECIAL INSTRUCTIONS:  1. The patient understands __________ dialysis  2. Follow up with [facility] on Tuesday,     Thursday, and Saturday at 6:15 [name] [name].m.  3. Charge nurse special instructions:  The charge nurse understands to make     [name] [name] followup appointment with Dr. Jenne Campus.  Make an appointment with [facility]     for permanent [name] [name]-V access, follow up on kidney biopsy results, and arrange     for CMET check on Thursday due to increase in AST and ALT.     Hillery Hunter, PA                   Mindi Slicker. Lowell Guitar, M.D.    MJG/MEDQ  D:  06/27/2003  T:  06/28/2003  Job:  [id number]   cc:   [facility]   Darlin Priestly, M.D.  [address]  [place]  [place] [address]  Fax: [phone]  Areatha Keas, M.D.  [address]  [address]  [place]  [place] [address]  Fax: [phone]   [facility]   [facility]

## 2010-10-22 NOTE — H&P (Signed)
NAME:  [name], [name]                       ACCOUNT NO.:  [id number]   MEDICAL RECORD NO.:  [id number]                   PATIENT TYPE:  OIB   LOCATION:  2857                                 FACILITY:  [facility]   PHYSICIAN:  Anselm Pancoast. Zachery Dakins, M.D.          DATE OF BIRTH:  [birth year]   DATE OF ADMISSION:  12/10/2003  DATE OF DISCHARGE:                                HISTORY & PHYSICAL   CHIEF COMPLAINT:  Bleeding from abdominal wound.  Recent CAPD catheter  insertion.   HISTORY:  Mrs. [name] [name] is a 35 year old female who had a CAPD catheter and a  small umbilical hernia repaired yesterday, and I released her afterwards,  and she did fine yesterday afternoon, but then stated at 11 o'clock she  noted a little bit of bleeding on the dressing.  She was seen today at  hemodialysis, and after checking briefly her incision they started her on  hemodialysis and then started having significant bleeding from the abdominal  wound and I think our office was called.  I saw her in the office at about  4:30 when she had a least a unit of blood on the abdominal wound.  You could  see bleeding coming from both the umbilical catheter from the area where the  CAPD had been placed, and also a little bit of blood coming from around the  exit site.  I sent her on to the hospital and we will need to explore her  wound, but coagulation studies were drawn, even though supposedly she was  reported to have no heparin.  Her prothrombin time three hours after coming  off hemodialysis was 75 seconds with a PTT which is significantly elevated.  Hemoglobin and hematocrit were not a significant drop, but of course this is  active bleeding and surely she is not equilibrated.  Her white count is only  6400, and hemoglobin was 11.7.  I then sent her to the OR where we plan to  explore the abdominal wound and make sure there is not a significant bleed  and evacuate hematoma, cultures, and then hopefully save the catheter.   I am  going to place her back on antibiotics.  She received 1 g of Kefzol  yesterday and she is receiving 1 g now.   PAST MEDICAL HISTORY:  She has been on hemodialysis.  She is a lupus patient  who had been on steroids that had been decreased to 5 mg every other day,  and her hemodialysis days are Monday, Wednesday, and Friday.  She is  presently being dialyzed through an Ash catheter in the right subclavian  area.   PHYSICAL EXAMINATION:  GENERAL:  She is a pleasant female, appears to have  some kind of a sore abdomen and is a little anxious because of the bleeding.  VITAL SIGNS:  Temperature 97.8, pulse 101, respirations 16, blood pressure  is 115/71.  She is 5 feet 4 inches and  weighs 108 pounds.  LUNGS:  She has good breath sounds bilaterally.  Ash catheter in the right  upper quadrant.  ABDOMEN:  She does not appear to have any peroneal sign tenderness.  Catheter exits the right lower quadrant.  There is a significant amount of  blood over the dressing in the office which I changed, and there was  probably another 200 cc on the dressing when she got over here a hour later.  She does not have a peripheral AV graft or fistula.   IMPRESSION:  Bleeding, recent wound from CAPD catheter and umbilical hernia  repair, probably anticoagulation possibly a bleeder.   PLAN:  The patient will have evacuation of the incision to inspect the  bleeding, culture, and hopefully we can salvage this catheter.                                                Anselm Pancoast. Zachery Dakins, M.D.    WJW/MEDQ  D:  12/10/2003  T:  12/10/2003  Job:  [id number]

## 2011-06-20 ENCOUNTER — Other Ambulatory Visit: Payer: Self-pay

## 2012-02-27 ENCOUNTER — Inpatient Hospital Stay (HOSPITAL_COMMUNITY)
Admission: AD | Admit: 2012-02-27 | Discharge: 2012-02-27 | Disposition: A | Discharge status: Home / Self Care | Payer: BC Managed Care – PPO | Source: Ambulatory Visit | Attending: Obstetrics and Gynecology | Admitting: Obstetrics and Gynecology

## 2012-02-27 ENCOUNTER — Encounter (HOSPITAL_COMMUNITY): Payer: Self-pay | Admitting: *Deleted

## 2012-02-27 ENCOUNTER — Inpatient Hospital Stay (HOSPITAL_COMMUNITY): Payer: BC Managed Care – PPO

## 2012-02-27 DIAGNOSIS — N83209 Unspecified ovarian cyst, unspecified side: Secondary | ICD-10-CM | POA: Insufficient documentation

## 2012-02-27 DIAGNOSIS — N83201 Unspecified ovarian cyst, right side: Secondary | ICD-10-CM

## 2012-02-27 DIAGNOSIS — O209 Hemorrhage in early pregnancy, unspecified: Secondary | ICD-10-CM

## 2012-02-27 DIAGNOSIS — O34599 Maternal care for other abnormalities of gravid uterus, unspecified trimester: Secondary | ICD-10-CM | POA: Insufficient documentation

## 2012-02-27 DIAGNOSIS — O26839 Pregnancy related renal disease, unspecified trimester: Secondary | ICD-10-CM | POA: Insufficient documentation

## 2012-02-27 DIAGNOSIS — Z3480 Encounter for supervision of other normal pregnancy, unspecified trimester: Secondary | ICD-10-CM

## 2012-02-27 DIAGNOSIS — N189 Chronic kidney disease, unspecified: Secondary | ICD-10-CM

## 2012-02-27 HISTORY — DX: Systemic lupus erythematosus, unspecified: M32.9

## 2012-02-27 HISTORY — DX: Chronic kidney disease, unspecified: N18.9

## 2012-02-27 HISTORY — DX: Reserved for concepts with insufficient information to code with codable children: IMO0002

## 2012-02-27 LAB — COMPREHENSIVE METABOLIC PANEL
Albumin: 3.1 g/dL — ABNORMAL LOW (ref 3.5–5.2)
BUN: 28 mg/dL — ABNORMAL HIGH (ref 6–23)
Calcium: 9.3 mg/dL (ref 8.4–10.5)
Creatinine, Ser: 2.26 mg/dL — ABNORMAL HIGH (ref 0.50–1.10)
Potassium: 4.5 mEq/L (ref 3.5–5.1)
Total Protein: 8.8 g/dL — ABNORMAL HIGH (ref 6.0–8.3)

## 2012-02-27 LAB — URINALYSIS, ROUTINE W REFLEX MICROSCOPIC
Bilirubin Urine: NEGATIVE
Glucose, UA: NEGATIVE mg/dL
Ketones, ur: NEGATIVE mg/dL
pH: 6 (ref 5.0–8.0)

## 2012-02-27 LAB — URINE MICROSCOPIC-ADD ON

## 2012-02-27 LAB — ABO/RH: ABO/RH(D): A POS

## 2012-02-27 LAB — CBC
MCH: 28.9 pg (ref 26.0–34.0)
MCHC: 32.2 g/dL (ref 30.0–36.0)
Platelets: 338 10*3/uL (ref 150–400)
RBC: 2.91 MIL/uL — ABNORMAL LOW (ref 3.87–5.11)

## 2012-02-27 MED ORDER — PRENATAL PLUS 27-1 MG PO TABS: 1.0000 | ORAL_TABLET | Freq: Every day | ORAL | Status: DC

## 2012-02-27 NOTE — MAU Note (Signed)
Pt has taken several pregnancy tests at home that have been positive and one at the urgent care clinic (09/19) that was also positive.  She made an appt with Dr. Claiborne Billings on Oct 10, but MD wanted her seen here to confirm pregnancy and placement.  Pt notes having pink discharge this morning and lower right abdominal pain.

## 2012-02-27 NOTE — MAU Provider Note (Signed)
Chief Complaint: Possible Pregnancy   First Provider Initiated Contact with Patient 02/27/12 1618     SUBJECTIVE HPI: [name] [name] is a 36 y.o. [id number] at [redacted]w[redacted]d by LMP who presents to MAU reporting first episode this morning of scant pink vaginal spotting and right suprapubic sharp pain. Denies antecedent intercourse, irritative vaginal discharge, dysuria, hematuria urgency or frequency.  Has appt at [facility] 03/15/12 and plans to make appt at [facility] also.   Past Medical History  Diagnosis Date  . Chronic kidney disease   . Lupus    OB History    Grav Para Term Preterm Abortions TAB SAB Ect Mult Living   4 2 1 1 1 1   1 2      # Outc Date GA Lbr Len/2nd Wgt Sex Del Anes PTL Lv   1 TRM 8/01 [redacted]w[redacted]d   M LVCS EPI No Yes   2A PRE 2/04 [redacted]w[redacted]d   F LVCS EPI Yes ND   Comments: passed after one month   2B  2/04 [redacted]w[redacted]d   M  EPI Yes Yes   3 TAB            4 CUR              Past Surgical History  Procedure Date  . Cesarean section   . Kidney transplant right kidney   History   Social History  . Marital Status: Married    Spouse Name: N/A    Number of Children: N/A  . Years of Education: N/A   Occupational History  . Not on file.   Social History Main Topics  . Smoking status: Never Smoker   . Smokeless tobacco: Not on file  . Alcohol Use: No  . Drug Use: No  . Sexually Active: Yes    Birth Control/ Protection: None   Other Topics Concern  . Not on file   Social History Narrative  . No narrative on file   No current facility-administered medications on file prior to encounter.   No current outpatient prescriptions on file prior to encounter.   Allergies  Allergen Reactions  . Ancef (Cefazolin) Itching    ROS: Pertinent items in HPI  OBJECTIVE Blood pressure 123/92, pulse 102, temperature 99.4 F (37.4 C), temperature source Oral, resp. rate 20, height 5\' 5"  (1.651 m), weight 50.349 kg (111 lb), last menstrual period 01/23/2012. BP recheck  118/88  GENERAL: Well-developed, well-nourished female in no acute distress.  HEENT: Normocephalic HEART: normal rate RESP: normal effort ABDOMEN: Soft, minimal right suprapubic tenderness EXTREMITIES: Nontender, no edema NEURO: Alert and oriented SPECULUM EXAM: NEFG, physiologic discharge, no blood noted, cervix clean BIMANUAL: cervix thick posterior, closed, uterus slightly enlarged. There is right adnexal tenderness no obvious mass.  LAB RESULTS Results for orders placed during the hospital encounter of 02/27/12 (from the past 24 hour(s))  URINALYSIS, ROUTINE W REFLEX MICROSCOPIC     Status: Abnormal   Collection Time   02/27/12  3:35 PM      Component Value Range   Color, Urine YELLOW  YELLOW   APPearance HAZY (*) CLEAR   Specific Gravity, Urine 1.025  1.005 - 1.030   pH 6.0  5.0 - 8.0   Glucose, UA NEGATIVE  NEGATIVE mg/dL   Hgb urine dipstick TRACE (*) NEGATIVE   Bilirubin Urine NEGATIVE  NEGATIVE   Ketones, ur NEGATIVE  NEGATIVE mg/dL   Protein, ur 30 (*) NEGATIVE mg/dL   Urobilinogen, UA 0.2  0.0 - 1.0 mg/dL  Nitrite NEGATIVE  NEGATIVE   Leukocytes, UA SMALL (*) NEGATIVE  URINE MICROSCOPIC-ADD ON     Status: Abnormal   Collection Time   02/27/12  3:35 PM      Component Value Range   Squamous Epithelial / LPF MANY (*) RARE   WBC, UA 7-10  <3 WBC/hpf   RBC / HPF 0-2  <3 RBC/hpf   Bacteria, UA FEW (*) RARE  POCT PREGNANCY, URINE     Status: Abnormal   Collection Time   02/27/12  3:41 PM      Component Value Range   Preg Test, Ur POSITIVE (*) NEGATIVE  CBC     Status: Abnormal   Collection Time   02/27/12  4:33 PM      Component Value Range   WBC 7.2  4.0 - 10.5 K/uL   RBC 2.91 (*) 3.87 - 5.11 MIL/uL   Hemoglobin 8.4 (*) 12.0 - 15.0 g/dL   HCT 65.7 (*) 84.6 - 96.2 %   MCV 89.7  78.0 - 100.0 fL   MCH 28.9  26.0 - 34.0 pg   MCHC 32.2  30.0 - 36.0 g/dL   RDW 95.2  84.1 - 32.4 %   Platelets 338  150 - 400 K/uL  HCG, QUANTITATIVE, PREGNANCY     Status: Abnormal    Collection Time   02/27/12  4:33 PM      Component Value Range   hCG, Beta Chain, Quant, S 4297 (*) <5 mIU/mL  COMPREHENSIVE METABOLIC PANEL     Status: Abnormal   Collection Time   02/27/12  4:33 PM      Component Value Range   Sodium 134 (*) 135 - 145 mEq/L   Potassium 4.5  3.5 - 5.1 mEq/L   Chloride 102  96 - 112 mEq/L   CO2 20  19 - 32 mEq/L   Glucose, Bld 84  70 - 99 mg/dL   BUN 28 (*) 6 - 23 mg/dL   Creatinine, Ser 4.01 (*) 0.50 - 1.10 mg/dL   Calcium 9.3  8.4 - 02.7 mg/dL   Total Protein 8.8 (*) 6.0 - 8.3 g/dL   Albumin 3.1 (*) 3.5 - 5.2 g/dL   AST 10  0 - 37 U/L   ALT <5  0 - 35 U/L   Alkaline Phosphatase 35 (*) 39 - 117 U/L   Total Bilirubin 0.3  0.3 - 1.2 mg/dL   GFR calc non Af Amer 27 (*) >90 mL/min   GFR calc Af Amer 31 (*) >90 mL/min    IMAGING [place]: IUGS c/w [redacted]w[redacted]d, YS seen, no embryo seen; rt hemorrhagic ovarian cyst 4.5 cm  ED COURSE  ASSESSMENT 1. Bleeding in early pregnancy   2. Chronic renal disease   3. Normal pregnancy, repeat   4. Ovarian cyst, right   36 yo [id number] at [redacted]w[redacted]d with IUP, probable implantation spotting  PLAN D/W Dr. Claiborne Billings Discharge home she will keep her followup appointment at [facility] On 03/15/2012. Encouraged to get an appointment with her [facility] physician as soon as possible. Call office if pain worsens or bleeding recurs   Medication List     As of 02/27/2012  4:33 PM    ASK your doctor about these medications         aspirin EC 81 MG tablet   Take 81 mg by mouth daily.      azaTHIOprine 50 MG tablet   Commonly known as: IMURAN   Take 100 mg by mouth daily.  predniSONE 5 MG tablet   Commonly known as: DELTASONE   Take 5 mg by mouth daily.      tacrolimus 5 MG capsule   Commonly known as: PROGRAF   Take 5 mg by mouth 2 (two) times daily.         Danae Orleans, CNM 02/27/2012  4:33 PM   Note: lab has come to redraw the blood for ABO/Rh duetto ? Atypical antibodies. (Pt has had blood transfusions in the  past) Will call her with result.

## 2013-11-06 ENCOUNTER — Other Ambulatory Visit: Payer: Self-pay | Admitting: Family

## 2013-11-07 ENCOUNTER — Other Ambulatory Visit: Payer: Self-pay | Admitting: Family

## 2013-11-07 DIAGNOSIS — N6320 Unspecified lump in the left breast, unspecified quadrant: Secondary | ICD-10-CM

## 2013-11-19 ENCOUNTER — Encounter (INDEPENDENT_AMBULATORY_CARE_PROVIDER_SITE_OTHER): Payer: Self-pay

## 2013-11-19 ENCOUNTER — Ambulatory Visit
Admission: RE | Admit: 2013-11-19 | Discharge: 2013-11-19 | Disposition: A | Discharge status: Home / Self Care | Payer: 59 | Source: Ambulatory Visit | Attending: Family | Admitting: Family

## 2013-11-19 DIAGNOSIS — N6320 Unspecified lump in the left breast, unspecified quadrant: Secondary | ICD-10-CM

## 2014-05-19 ENCOUNTER — Encounter: Payer: Self-pay | Admitting: Vascular Surgery

## 2014-05-19 ENCOUNTER — Other Ambulatory Visit: Payer: Self-pay

## 2014-05-19 DIAGNOSIS — N184 Chronic kidney disease, stage 4 (severe): Secondary | ICD-10-CM

## 2014-05-20 ENCOUNTER — Other Ambulatory Visit (HOSPITAL_COMMUNITY): Payer: 59

## 2014-05-20 ENCOUNTER — Encounter (HOSPITAL_COMMUNITY): Payer: 59

## 2014-05-20 ENCOUNTER — Ambulatory Visit: Payer: 59 | Admitting: Vascular Surgery

## 2014-05-26 ENCOUNTER — Emergency Department (HOSPITAL_COMMUNITY)
Admission: EM | Admit: 2014-05-26 | Discharge: 2014-05-26 | Disposition: A | Discharge status: Home / Self Care | Payer: 59 | Attending: Emergency Medicine | Admitting: Emergency Medicine

## 2014-05-26 ENCOUNTER — Encounter (HOSPITAL_COMMUNITY): Payer: Self-pay | Admitting: Family Medicine

## 2014-05-26 DIAGNOSIS — D649 Anemia, unspecified: Secondary | ICD-10-CM | POA: Diagnosis not present

## 2014-05-26 DIAGNOSIS — Z992 Dependence on renal dialysis: Secondary | ICD-10-CM | POA: Diagnosis not present

## 2014-05-26 DIAGNOSIS — Z79899 Other long term (current) drug therapy: Secondary | ICD-10-CM | POA: Diagnosis not present

## 2014-05-26 DIAGNOSIS — Z940 Kidney transplant status: Secondary | ICD-10-CM | POA: Insufficient documentation

## 2014-05-26 DIAGNOSIS — Z8739 Personal history of other diseases of the musculoskeletal system and connective tissue: Secondary | ICD-10-CM | POA: Insufficient documentation

## 2014-05-26 DIAGNOSIS — N186 End stage renal disease: Secondary | ICD-10-CM | POA: Diagnosis not present

## 2014-05-26 DIAGNOSIS — D582 Other hemoglobinopathies: Secondary | ICD-10-CM | POA: Diagnosis present

## 2014-05-26 DIAGNOSIS — Z7952 Long term (current) use of systemic steroids: Secondary | ICD-10-CM | POA: Diagnosis not present

## 2014-05-26 LAB — CBC WITH DIFFERENTIAL/PLATELET
BASOS ABS: 0 10*3/uL (ref 0.0–0.1)
BASOS PCT: 0 % (ref 0–1)
EOS ABS: 0.1 10*3/uL (ref 0.0–0.7)
Eosinophils Relative: 1 % (ref 0–5)
HCT: 19.6 % — ABNORMAL LOW (ref 36.0–46.0)
Hemoglobin: 5.7 g/dL — CL (ref 12.0–15.0)
Lymphocytes Relative: 40 % (ref 12–46)
Lymphs Abs: 1.9 10*3/uL (ref 0.7–4.0)
MCH: 25.4 pg — AB (ref 26.0–34.0)
MCHC: 29.1 g/dL — AB (ref 30.0–36.0)
MCV: 87.5 fL (ref 78.0–100.0)
MONOS PCT: 6 % (ref 3–12)
Monocytes Absolute: 0.3 10*3/uL (ref 0.1–1.0)
NEUTROS ABS: 2.6 10*3/uL (ref 1.7–7.7)
NEUTROS PCT: 53 % (ref 43–77)
PLATELETS: 215 10*3/uL (ref 150–400)
RBC: 2.24 MIL/uL — ABNORMAL LOW (ref 3.87–5.11)
RDW: 16.8 % — AB (ref 11.5–15.5)
WBC: 4.9 10*3/uL (ref 4.0–10.5)

## 2014-05-26 LAB — COMPREHENSIVE METABOLIC PANEL
ALBUMIN: 3.1 g/dL — AB (ref 3.5–5.2)
ALK PHOS: 71 U/L (ref 39–117)
ALT: <5 U/L (ref 0–35)
ANION GAP: 10 (ref 5–15)
AST: 14 U/L (ref 0–37)
BUN: 3 mg/dL — AB (ref 6–23)
CHLORIDE: 96 meq/L (ref 96–112)
CO2: 34 mEq/L — ABNORMAL HIGH (ref 19–32)
Calcium: 7.9 mg/dL — ABNORMAL LOW (ref 8.4–10.5)
Creatinine, Ser: 2.38 mg/dL — ABNORMAL HIGH (ref 0.50–1.10)
GFR calc Af Amer: 29 mL/min — ABNORMAL LOW (ref >90)
GFR calc non Af Amer: 25 mL/min — ABNORMAL LOW (ref >90)
Glucose, Bld: 85 mg/dL (ref 70–99)
POTASSIUM: 3 meq/L — AB (ref 3.7–5.3)
SODIUM: 140 meq/L (ref 137–147)
TOTAL PROTEIN: 8.5 g/dL — AB (ref 6.0–8.3)
Total Bilirubin: 0.3 mg/dL (ref 0.3–1.2)

## 2014-05-26 LAB — SAMPLE TO BLOOD BANK

## 2014-05-26 NOTE — ED Notes (Signed)
Pt sent here from dialysis with low hgb. Denies pain. sts she finished treatment.

## 2014-05-26 NOTE — ED Notes (Signed)
Lab called with critical hemoglobin of 5.7, EDP aware.

## 2014-05-26 NOTE — Discharge Instructions (Signed)
Anemia, Nonspecific Anemia is a condition in which the concentration of red blood cells or hemoglobin in the blood is below normal. Hemoglobin is a substance in red blood cells that carries oxygen to the tissues of the body. Anemia results in not enough oxygen reaching these tissues.  CAUSES  Common causes of anemia include:   Excessive bleeding. Bleeding may be internal or external. This includes excessive bleeding from periods (in women) or from the intestine.   Poor nutrition.   Chronic kidney, thyroid, and liver disease.  Bone marrow disorders that decrease red blood cell production.  Cancer and treatments for cancer.  HIV, AIDS, and their treatments.  Spleen problems that increase red blood cell destruction.  Blood disorders.  Excess destruction of red blood cells due to infection, medicines, and autoimmune disorders. SIGNS AND SYMPTOMS   Minor weakness.   Dizziness.   Headache.  Palpitations.   Shortness of breath, especially with exercise.   Paleness.  Cold sensitivity.  Indigestion.  Nausea.  Difficulty sleeping.  Difficulty concentrating. Symptoms may occur suddenly or they may develop slowly.  DIAGNOSIS  Additional blood tests are often needed. These help your health care provider determine the best treatment. Your health care provider will check your stool for blood and look for other causes of blood loss.  TREATMENT  Treatment varies depending on the cause of the anemia. Treatment can include:   Supplements of iron, vitamin B12, or folic acid.   Hormone medicines.   A blood transfusion. This may be needed if blood loss is severe.   Hospitalization. This may be needed if there is significant continual blood loss.   Dietary changes.  Spleen removal. HOME CARE INSTRUCTIONS Keep all follow-up appointments. It often takes many weeks to correct anemia, and having your health care provider check on your condition and your response to  treatment is very important. SEEK IMMEDIATE MEDICAL CARE IF:   You develop extreme weakness, shortness of breath, or chest pain.   You become dizzy or have trouble concentrating.  You develop heavy vaginal bleeding.   You develop a rash.   You have bloody or black, tarry stools.   You faint.   You vomit up blood.   You vomit repeatedly.   You have abdominal pain.  You have a fever or persistent symptoms for more than 2-3 days.   You have a fever and your symptoms suddenly get worse.   You are dehydrated.  MAKE SURE YOU:  Understand these instructions.  Will watch your condition.  Will get help right away if you are not doing well or get worse. Document Released: 06/30/2004 Document Revised: 01/23/2013 Document Reviewed: 11/16/2012 ExitCare Patient Information 2015 [employer]. This information is not intended to replace advice given to you by your health care provider. Make sure you discuss any questions you have with your health care provider.  

## 2014-05-26 NOTE — ED Notes (Signed)
Pt refusing peripheral IV at the time. EDP at bedside.

## 2014-05-26 NOTE — ED Notes (Signed)
Pt states she received hemodialysis treatment this morning, was able to finish full treatment. Reports hemoglobin of 4.8 today, denies symptoms at the time. Respirations unlabored. Pt is alert and oriented x4.

## 2014-05-26 NOTE — ED Provider Notes (Signed)
CSN: [id number]     Arrival date & time 05/26/14  1127 History   First MD Initiated Contact with Patient 05/26/14 1143     Chief Complaint  Patient presents with  . low hgb      (Consider location/radiation/quality/duration/timing/severity/associated sxs/prior Treatment) The history is provided by the patient.   patient was sent in from dialysis with a reported hemoglobin of 4.8. Has a history of rather severe anemia. [facility] records reviewed. Most recent hemoglobin by them was 5.0 and reportedly was transfused 1 unit. Patient states that she feels fine. Has a previous kidney transplant and recently was started back on dialysis. No bleeding. No black stools. The lightheadedness or dizziness.  Past Medical History  Diagnosis Date  . Chronic kidney disease   . Lupus    Past Surgical History  Procedure Laterality Date  . Cesarean section    . Kidney transplant  right kidney   Family History  Problem Relation Age of Onset  . Hypertension Mother   . Cancer Maternal Aunt   . Cancer Maternal Uncle    History  Substance Use Topics  . Smoking status: Never Smoker   . Smokeless tobacco: Not on file  . Alcohol Use: No   OB History    Gravida Para Term Preterm AB TAB SAB Ectopic Multiple Living   4 2 1 1 1 1   1 2      Review of Systems  Constitutional: Negative for chills.  Cardiovascular: Negative for chest pain.  Gastrointestinal: Negative for blood in stool, abdominal distention and anal bleeding.  Skin: Negative for wound.  Hematological: Negative for adenopathy. Does not bruise/bleed easily.      Allergies  Ancef  Home Medications   Prior to Admission medications   Medication Sig Start Date End Date Taking? Authorizing Provider  mycophenolate (CELLCEPT) 500 MG tablet Take 500 mg by mouth 2 (two) times daily.   Yes Historical Provider, MD  predniSONE (DELTASONE) 10 MG tablet Take 10 mg by mouth daily with breakfast.   Yes Historical Provider, MD  tacrolimus (PROGRAF)  5 MG capsule Take 5 mg by mouth 2 (two) times daily.   Yes Historical Provider, MD  prenatal vitamin w/FE, FA (PRENATAL 1 + 1) 27-1 MG TABS Take 1 tablet by mouth daily. Patient not taking: Reported on 05/26/2014 02/27/12   Deirdre C Poe, CNM   BP 128/90 mmHg  Pulse 94  Temp(Src) 98.3 F (36.8 C)  Resp 16  SpO2 100%  LMP 04/15/2014 Physical Exam  Constitutional: She appears well-developed and well-nourished.  HENT:  Head: Normocephalic.  Cardiovascular: Normal rate and regular rhythm.   Pulmonary/Chest: Effort normal.  Dialysis catheter to left chest Setters.  Abdominal: Soft. There is no tenderness.  Neurological: She is alert.  Skin: Skin is warm. No pallor.    ED Course  Procedures (including critical care time) Labs Review Labs Reviewed  CBC WITH DIFFERENTIAL - Abnormal; Notable for the following:    RBC 2.24 (*)    Hemoglobin 5.7 (*)    HCT 19.6 (*)    MCH 25.4 (*)    MCHC 29.1 (*)    RDW 16.8 (*)    All other components within normal limits  COMPREHENSIVE METABOLIC PANEL  SAMPLE TO BLOOD BANK    Imaging Review No results found.   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  Chronic. Hemoglobin 5.7 but not symptomatic. Recently had hemoglobin of 5 at [facility] and was transfused 1 unit. Discussed with the patient of transfusion versus monitoring and outpatient follow-up. Patient would rather follow-up as an outpatient with her nephrologist and with [facility] as needed. She is not hypertensive. This is likely from her chronic renal disease. Will discharge home.    Juliet Rude. Rubin Payor, MD 05/26/14 1313

## 2014-07-28 ENCOUNTER — Encounter (HOSPITAL_COMMUNITY): Payer: Self-pay | Admitting: Family Medicine

## 2014-07-28 ENCOUNTER — Emergency Department (HOSPITAL_COMMUNITY): Payer: 59

## 2014-07-28 ENCOUNTER — Emergency Department (HOSPITAL_COMMUNITY)
Admission: EM | Admit: 2014-07-28 | Discharge: 2014-07-28 | Disposition: A | Discharge status: Home / Self Care | Payer: 59 | Attending: Emergency Medicine | Admitting: Emergency Medicine

## 2014-07-28 DIAGNOSIS — R0602 Shortness of breath: Secondary | ICD-10-CM | POA: Diagnosis not present

## 2014-07-28 DIAGNOSIS — Z79899 Other long term (current) drug therapy: Secondary | ICD-10-CM | POA: Diagnosis not present

## 2014-07-28 DIAGNOSIS — Z7952 Long term (current) use of systemic steroids: Secondary | ICD-10-CM | POA: Insufficient documentation

## 2014-07-28 DIAGNOSIS — N186 End stage renal disease: Secondary | ICD-10-CM | POA: Insufficient documentation

## 2014-07-28 DIAGNOSIS — Z940 Kidney transplant status: Secondary | ICD-10-CM | POA: Diagnosis not present

## 2014-07-28 DIAGNOSIS — Z8739 Personal history of other diseases of the musculoskeletal system and connective tissue: Secondary | ICD-10-CM | POA: Diagnosis not present

## 2014-07-28 DIAGNOSIS — D649 Anemia, unspecified: Secondary | ICD-10-CM | POA: Diagnosis present

## 2014-07-28 LAB — COMPREHENSIVE METABOLIC PANEL
ALK PHOS: 84 U/L (ref 39–117)
ALT: 15 U/L (ref 0–35)
AST: 35 U/L (ref 0–37)
Albumin: 2.6 g/dL — ABNORMAL LOW (ref 3.5–5.2)
Anion gap: 8 (ref 5–15)
BILIRUBIN TOTAL: 0.5 mg/dL (ref 0.3–1.2)
BUN: 5 mg/dL — AB (ref 6–23)
CALCIUM: 8 mg/dL — AB (ref 8.4–10.5)
CHLORIDE: 99 mmol/L (ref 96–112)
CO2: 30 mmol/L (ref 19–32)
CREATININE: 3.61 mg/dL — AB (ref 0.50–1.10)
GFR calc Af Amer: 17 mL/min — ABNORMAL LOW (ref >90)
GFR calc non Af Amer: 15 mL/min — ABNORMAL LOW (ref >90)
GLUCOSE: 99 mg/dL (ref 70–99)
POTASSIUM: 3 mmol/L — AB (ref 3.5–5.1)
SODIUM: 137 mmol/L (ref 135–145)
TOTAL PROTEIN: 9.2 g/dL — AB (ref 6.0–8.3)

## 2014-07-28 LAB — CBC
HCT: 24.3 % — ABNORMAL LOW (ref 36.0–46.0)
Hemoglobin: 7.1 g/dL — ABNORMAL LOW (ref 12.0–15.0)
MCH: 26.5 pg (ref 26.0–34.0)
MCHC: 29.2 g/dL — ABNORMAL LOW (ref 30.0–36.0)
MCV: 90.7 fL (ref 78.0–100.0)
PLATELETS: 217 10*3/uL (ref 150–400)
RBC: 2.68 MIL/uL — AB (ref 3.87–5.11)
RDW: 16.3 % — AB (ref 11.5–15.5)
WBC: 8.5 10*3/uL (ref 4.0–10.5)

## 2014-07-28 LAB — TYPE AND SCREEN
ABO/RH(D): A POS
ANTIBODY SCREEN: POSITIVE
DAT, IGG: NEGATIVE

## 2014-07-28 NOTE — ED Notes (Signed)
Pt came from dialysis-- states she just can't catch breath-- pt states she will have to pick one of her children up at 3pm, and is unable to stay overnight. Will agree to coming back if needed for transfusion.

## 2014-07-28 NOTE — ED Provider Notes (Signed)
CSN: [id number]     Arrival date & time 07/28/14  1220 History   First MD Initiated Contact with Patient 07/28/14 1234     Chief Complaint  Patient presents with  . low hgb      (Consider location/radiation/quality/duration/timing/severity/associated sxs/prior Treatment) HPI    PCP: No primary care provider on file. Blood pressure 133/93, pulse 100, temperature 98.2 F (36.8 C), temperature source Oral, resp. rate 14, last menstrual period 06/24/2014, SpO2 100 %, unknown if currently breastfeeding.  [name] [name] is a 39 y.o.female with a significant PMH of chronic kidney disease, lupus, kidney transplant presents to the ER after it was recommended to her by the nephrology PA. She went in for dialysis today and completed dialysis. She last had her Hemoglobin checked 1 week ago and it was 6.1. Today before dialysis she endorsed some SOB over the past few days with mild weakness. She denies having to take breaks when walking to catch her breaths and she is able to speak in full sentences. The shortness of breath is not made better or worse by anything. She has had some mild cough. She endorses moderate improvement after dialysis today.  Negative Review of Symptoms: LE swelling, dizziness, nausea, vomiting, fevers, abdominal distention, headache, confusion, bleeding, CP.   Past Medical History  Diagnosis Date  . Chronic kidney disease   . Lupus    Past Surgical History  Procedure Laterality Date  . Cesarean section    . Kidney transplant  right kidney   Family History  Problem Relation Age of Onset  . Hypertension Mother   . Cancer Maternal Aunt   . Cancer Maternal Uncle    History  Substance Use Topics  . Smoking status: Never Smoker   . Smokeless tobacco: Not on file  . Alcohol Use: No   OB History    Gravida Para Term Preterm AB TAB SAB Ectopic Multiple Living   Review of Systems  10 Systems reviewed and are negative for acute change except  as noted in the HPI.   Allergies  Ancef  Home Medications   Prior to Admission medications   Medication Sig Start Date End Date Taking? Authorizing Provider  mycophenolate (CELLCEPT) 500 MG tablet Take 500 mg by mouth 2 (two) times daily.   Yes Historical Provider, MD  predniSONE (DELTASONE) 10 MG tablet Take 10 mg by mouth daily with breakfast.   Yes Historical Provider, MD  tacrolimus (PROGRAF) 5 MG capsule Take 5 mg by mouth 2 (two) times daily.   Yes Historical Provider, MD  prenatal vitamin w/FE, FA (PRENATAL 1 + 1) 27-1 MG TABS Take 1 tablet by mouth daily. Patient not taking: Reported on 05/26/2014 02/27/12   Deirdre C Poe, CNM   BP 128/98 mmHg  Pulse 88  Temp(Src) 98.4 F (36.9 C) (Oral)  Resp 16  SpO2 100%  LMP 06/24/2014 Physical Exam  Constitutional: She appears well-developed and well-nourished. No distress.  HENT:  Head: Normocephalic and atraumatic.  Eyes: Pupils are equal, round, and reactive to light.  Neck: Normal range of motion. Neck supple.  Cardiovascular: Normal rate and regular rhythm.   Pulmonary/Chest: Effort normal. She has decreased breath sounds (bilateral lower lung bases). She has no wheezes. She has no rhonchi. She has no rales.  Abdominal: Soft. Bowel sounds are normal. She exhibits no distension and no fluid wave. There is no tenderness. There is no rigidity, no rebound  and no guarding.  Musculoskeletal:  No LE swelling  Neurological: She is alert.  Skin: Skin is warm and dry. She is not diaphoretic. No cyanosis. There is pallor. Nails show no clubbing.  Nursing note and vitals reviewed.   ED Course  Procedures (including critical care time) Labs Review Labs Reviewed  COMPREHENSIVE METABOLIC PANEL - Abnormal; Notable for the following:    Potassium 3.0 (*)    BUN 5 (*)    Creatinine, Ser 3.61 (*)    Calcium 8.0 (*)    Total Protein 9.2 (*)    Albumin 2.6 (*)    GFR calc non Af Amer 15 (*)    GFR calc Af Amer 17 (*)    All other  components within normal limits  CBC - Abnormal; Notable for the following:    RBC 2.68 (*)    Hemoglobin 7.1 (*)    HCT 24.3 (*)    MCHC 29.2 (*)    RDW 16.3 (*)    All other components within normal limits  TYPE AND SCREEN    Imaging Review Dg Chest 2 View  07/28/2014   CLINICAL DATA:  Short of breath  EXAM: CHEST  2 VIEW  COMPARISON:  01/14/2005  FINDINGS: Mild cardiomegaly. Small bilateral pleural effusions. Mild interstitial edema has increased. Bibasilar atelectasis. Left internal jugular vein tunneled dialysis catheter tip at the cavoatrial junction. No pneumothorax.  IMPRESSION: There are small pleural effusions and mild interstitial edema. This is consistent with mild volume overload.   Electronically Signed   By: Jolaine Click M.D.   On: 07/28/2014 14:02     EKG Interpretation None      MDM   Final diagnoses:  Shortness of breath    The patient is endorsing SOB and mild weakness in the setting of chronic anemia due to kidney disease. She informs the nurse and myself at the beginning of the evaluation that she needs to leave to get her kids by 3 pm. She originally planned to leave the hospital prior to evaluation or blood work AMA but Allison Quarry, RN and myself were able to talk her in to staying to be further evaluated. - the patient tells [place] that if she needs treatment then she will need to come back after she gets her kids.  Her hemoglobin is 7.1 which is improved from last week. Her chest xray shows some mild fluid overload. I spoke with Dr. Arlean Hopping, the patient will most likely need some more fluid taken off by dialysis. He has scheduled for her 12 pm tomorrow. She does not need admission for this. Her hemoglobin is stable and does not require transfusion at this time. Pt made aware. She plans to go to dialysis tomorrow.  39 y.o.[name] [name]'s evaluation in the Emergency Department is complete. It has been determined that no acute conditions requiring further emergency  intervention are present at this time. The patient/guardian have been advised of the diagnosis and plan. We have discussed signs and symptoms that warrant return to the ED, such as changes or worsening in symptoms.  Vital signs are stable at discharge. Filed Vitals:   07/28/14 1505  BP: 128/98  Pulse: 88  Temp: 98.4 F (36.9 C)  Resp: 16    Patient/guardian has voiced understanding and agreed to follow-up with the PCP or specialist.    Dorthula Matas, PA-C 07/28/14 1523  Flint Melter, MD 07/30/14 [phone]

## 2014-07-28 NOTE — ED Notes (Signed)
p here from weakness and SOB due to low hgb. sts was told hgb was 6.1. sts last dialysis this am.

## 2014-10-09 ENCOUNTER — Other Ambulatory Visit: Payer: Self-pay | Admitting: Nephrology

## 2014-10-09 DIAGNOSIS — R1084 Generalized abdominal pain: Secondary | ICD-10-CM

## 2014-10-14 ENCOUNTER — Ambulatory Visit
Admission: RE | Admit: 2014-10-14 | Discharge: 2014-10-14 | Disposition: A | Discharge status: Home / Self Care | Payer: 59 | Source: Ambulatory Visit | Attending: Nephrology | Admitting: Nephrology

## 2014-10-14 DIAGNOSIS — R1084 Generalized abdominal pain: Secondary | ICD-10-CM

## 2014-10-27 ENCOUNTER — Telehealth: Payer: Self-pay | Admitting: Internal Medicine

## 2014-10-27 NOTE — Telephone Encounter (Signed)
Received records from [facility] for appointment on 10/29/14 with Dr Rennis Golden.  Records given to [facility] for Dr [facility] schedule on 10/29/14. lp

## 2014-10-29 ENCOUNTER — Ambulatory Visit (INDEPENDENT_AMBULATORY_CARE_PROVIDER_SITE_OTHER): Payer: 59 | Admitting: Internal Medicine

## 2014-10-29 ENCOUNTER — Encounter: Payer: Self-pay | Admitting: Internal Medicine

## 2014-10-29 VITALS — BP 88/60 | HR 90 | Ht 64.0 in | Wt 104.3 lb

## 2014-10-29 DIAGNOSIS — Z992 Dependence on renal dialysis: Secondary | ICD-10-CM

## 2014-10-29 DIAGNOSIS — M3214 Glomerular disease in systemic lupus erythematosus: Secondary | ICD-10-CM | POA: Insufficient documentation

## 2014-10-29 DIAGNOSIS — Z01818 Encounter for other preprocedural examination: Secondary | ICD-10-CM

## 2014-10-29 DIAGNOSIS — M329 Systemic lupus erythematosus, unspecified: Secondary | ICD-10-CM | POA: Diagnosis not present

## 2014-10-29 DIAGNOSIS — Z01811 Encounter for preprocedural respiratory examination: Secondary | ICD-10-CM | POA: Diagnosis not present

## 2014-10-29 DIAGNOSIS — N186 End stage renal disease: Secondary | ICD-10-CM

## 2014-10-29 DIAGNOSIS — Z01810 Encounter for preprocedural cardiovascular examination: Secondary | ICD-10-CM | POA: Insufficient documentation

## 2014-10-29 DIAGNOSIS — R9431 Abnormal electrocardiogram [ECG] [EKG]: Secondary | ICD-10-CM

## 2014-10-29 NOTE — Patient Instructions (Signed)
Dr Rennis Golden has requested that you have a lexiscan myoview. For further information please visit [email or web address] Please follow instruction sheet, as given. You will receive a phone call with results of the study.

## 2014-10-29 NOTE — Progress Notes (Signed)
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 She goes to dialysis but does not regularly see a nephrologist. Dr. Kathrene Bongo is in charge of the dialysis clinic and referred her for stress testing prior to possible kidney transplant at [facility]. She has no cardiac history. She apparently had an echocardiogram in the distant past which was normal. She's never had any stress testing. She denies any chest pain or shortness of breath. She apparently has had cholesterol testing in the past she is not on medication for this. When I asked her if she has a rheumatologist for her lupus she reported that she did not. Apparently her CellCept, tacrolimus and prednisone are managed by her nephrologist.  PMHx:  Past Medical History  Diagnosis Date  . Chronic kidney disease     s/p right kidney transplant  . Lupus     Past Surgical History  Procedure Laterality Date  . Cesarean section    . Kidney transplant  right kidney    FAMHx:  Family History  Problem Relation Age of Onset  . Hypertension Mother   . Cancer Maternal Aunt   . Cancer Maternal Uncle     SOCHx:   reports that she has never smoked. She does not have any smokeless tobacco history on file. She reports that she does not drink alcohol or use illicit drugs.  ALLERGIES:  Allergies  Allergen Reactions  . Ancef [Cefazolin] Itching    ROS: A comprehensive review of systems was negative.  HOME MEDS: Current Outpatient Prescriptions  Medication Sig Dispense Refill  . mycophenolate (CELLCEPT) 500 MG tablet Take 500 mg by mouth 2 (two) times daily.    . predniSONE (DELTASONE) 10 MG tablet Take 10 mg by mouth daily with breakfast.    . tacrolimus (PROGRAF) 5 MG capsule Take 5 mg by mouth 2 (two) times daily.     No current  facility-administered medications for this visit.    LABS/IMAGING: No results found for this or any previous visit (from the past 48 hour(s)). No results found.  WEIGHTS: Wt Readings from Last 3 Encounters:  10/29/14 104 lb 4.8 oz (47.31 kg)  02/27/12 111 lb (50.349 kg)    VITALS: BP 88/60 mmHg  Pulse 90  Ht  (1.626 m)  Wt 104 lb 4.8 oz (47.31 kg)  BMI 17.89 kg/m2  EXAM: General appearance: alert, no distress and Thin Neck: no carotid bruit, no JVD and thyroid not enlarged, symmetric, no tenderness/mass/nodules Lungs: clear to auscultation bilaterally Heart: regular rate and rhythm, S1, S2 normal, no murmur, click, rub or gallop Abdomen: soft, non-tender; bowel sounds normal; no masses,  no organomegaly Extremities: extremities normal, atraumatic, no cyanosis or edema Pulses: 2+ and symmetric Skin: Skin color, texture, turgor normal. No rashes or lesions Neurologic: Grossly normal Psych: Flat affect  EKG: Normal sinus rhythm at 90, biatrial enlargement, T-wave abnormalities laterally concerning for ischemia  ASSESSMENT: 1. End-stage renal disease on hemodialysis (Tuesday, Thursday, Saturday) 2. History of lupus nephritis 3. SLE 4. Abnormal EKG  PLAN: 1.   [name] [name] is desiring a kidney transplant. She does have an abnormal EKG with some changes that could be related to ischemia although is asymptomatic. I recommend a Lexiscan nuclear stress test for preoperative risk evaluation. Lupus will put her at  increased risk for coronary disease. It's recommended that she is followed by a rheumatologist as well. I do not believe she has a primary care provider either. We'll obtain her stress test and encouraged follow-up with her nephrologist.  Chrystie Nose, MD, [facility] Attending Cardiologist [facility]  Chrystie Nose 10/29/2014, 12:49 PM

## 2014-11-13 ENCOUNTER — Telehealth (HOSPITAL_COMMUNITY): Payer: Self-pay

## 2014-11-13 ENCOUNTER — Other Ambulatory Visit (HOSPITAL_COMMUNITY): Payer: Self-pay | Admitting: Nephrology

## 2014-11-13 DIAGNOSIS — Z01818 Encounter for other preprocedural examination: Secondary | ICD-10-CM

## 2014-11-13 NOTE — Telephone Encounter (Signed)
Encounter complete. 

## 2014-11-18 ENCOUNTER — Ambulatory Visit (HOSPITAL_BASED_OUTPATIENT_CLINIC_OR_DEPARTMENT_OTHER)
Admission: RE | Admit: 2014-11-18 | Discharge: 2014-11-18 | Disposition: A | Discharge status: Home / Self Care | Payer: 59 | Source: Ambulatory Visit

## 2014-11-18 ENCOUNTER — Ambulatory Visit (HOSPITAL_COMMUNITY)
Admission: RE | Admit: 2014-11-18 | Discharge: 2014-11-18 | Disposition: A | Discharge status: Home / Self Care | Payer: 59 | Source: Ambulatory Visit | Attending: Internal Medicine | Admitting: Internal Medicine

## 2014-11-18 DIAGNOSIS — Z992 Dependence on renal dialysis: Secondary | ICD-10-CM | POA: Insufficient documentation

## 2014-11-18 DIAGNOSIS — Z01818 Encounter for other preprocedural examination: Secondary | ICD-10-CM | POA: Diagnosis not present

## 2014-11-18 DIAGNOSIS — M329 Systemic lupus erythematosus, unspecified: Secondary | ICD-10-CM

## 2014-11-18 DIAGNOSIS — R9439 Abnormal result of other cardiovascular function study: Secondary | ICD-10-CM | POA: Insufficient documentation

## 2014-11-18 DIAGNOSIS — N186 End stage renal disease: Secondary | ICD-10-CM

## 2014-11-18 LAB — MYOCARDIAL PERFUSION IMAGING
CHL CUP NUCLEAR SDS: 6
CHL CUP NUCLEAR SRS: 9
CHL CUP NUCLEAR SSS: 15
CSEPPHR: 104 {beats}/min
LV dias vol: 132 mL
LVSYSVOL: 93 mL
Nuc Stress EF: 29 %
Rest HR: 75 {beats}/min
TID: 0.95

## 2014-11-18 MED ORDER — TECHNETIUM TC 99M SESTAMIBI GENERIC - CARDIOLITE
30.8000 | Freq: Once | INTRAVENOUS | Status: AC | PRN
  Administered 2014-11-18: 30.8 via INTRAVENOUS

## 2014-11-18 MED ORDER — AMINOPHYLLINE 25 MG/ML IV SOLN
75.0000 mg | Freq: Once | INTRAVENOUS | Status: AC
  Administered 2014-11-18: 75 mg via INTRAVENOUS

## 2014-11-18 MED ORDER — TECHNETIUM TC 99M SESTAMIBI GENERIC - CARDIOLITE
10.7000 | Freq: Once | INTRAVENOUS | Status: AC | PRN
  Administered 2014-11-18: 11 via INTRAVENOUS

## 2014-11-18 MED ORDER — REGADENOSON 0.4 MG/5ML IV SOLN
0.4000 mg | Freq: Once | INTRAVENOUS | Status: AC
  Administered 2014-11-18: 0.4 mg via INTRAVENOUS

## 2014-11-18 NOTE — Progress Notes (Signed)
2D Echo Performed.  Ruvim Risko, RDCS.  The Ejection Fraction is moderately reduced, in the 35% range.  Ms. Blumenschein was released to her Nuclear Myoview Stress Test.

## 2014-11-19 NOTE — Progress Notes (Signed)
I copied her on the result note. I will try to reach the nephrologist this week.  She should have the cath soon. Any cardiologist can do this one, since I will be gone. Better to do sooner than later.   Dr. Rexene Edison

## 2014-12-03 ENCOUNTER — Encounter (HOSPITAL_COMMUNITY): Payer: Self-pay | Admitting: Pharmacy Technician

## 2014-12-03 ENCOUNTER — Ambulatory Visit (INDEPENDENT_AMBULATORY_CARE_PROVIDER_SITE_OTHER): Payer: 59 | Admitting: Internal Medicine

## 2014-12-03 ENCOUNTER — Encounter: Payer: Self-pay | Admitting: Interventional Cardiology

## 2014-12-03 ENCOUNTER — Encounter: Payer: Self-pay | Admitting: Internal Medicine

## 2014-12-03 VITALS — BP 82/68 | HR 89 | Ht 64.0 in | Wt 104.0 lb

## 2014-12-03 DIAGNOSIS — Z01810 Encounter for preprocedural cardiovascular examination: Secondary | ICD-10-CM

## 2014-12-03 DIAGNOSIS — Z79899 Other long term (current) drug therapy: Secondary | ICD-10-CM

## 2014-12-03 DIAGNOSIS — D689 Coagulation defect, unspecified: Secondary | ICD-10-CM | POA: Diagnosis not present

## 2014-12-03 DIAGNOSIS — R5383 Other fatigue: Secondary | ICD-10-CM | POA: Diagnosis not present

## 2014-12-03 DIAGNOSIS — M3214 Glomerular disease in systemic lupus erythematosus: Secondary | ICD-10-CM

## 2014-12-03 DIAGNOSIS — R9439 Abnormal result of other cardiovascular function study: Secondary | ICD-10-CM | POA: Insufficient documentation

## 2014-12-03 DIAGNOSIS — I519 Heart disease, unspecified: Secondary | ICD-10-CM

## 2014-12-03 DIAGNOSIS — Z01818 Encounter for other preprocedural examination: Secondary | ICD-10-CM

## 2014-12-03 DIAGNOSIS — N186 End stage renal disease: Secondary | ICD-10-CM

## 2014-12-03 DIAGNOSIS — R9431 Abnormal electrocardiogram [ECG] [EKG]: Secondary | ICD-10-CM

## 2014-12-03 DIAGNOSIS — R931 Abnormal findings on diagnostic imaging of heart and coronary circulation: Secondary | ICD-10-CM

## 2014-12-03 DIAGNOSIS — Z992 Dependence on renal dialysis: Secondary | ICD-10-CM

## 2014-12-03 DIAGNOSIS — M329 Systemic lupus erythematosus, unspecified: Secondary | ICD-10-CM

## 2014-12-03 DIAGNOSIS — I255 Ischemic cardiomyopathy: Secondary | ICD-10-CM

## 2014-12-03 DIAGNOSIS — I428 Other cardiomyopathies: Secondary | ICD-10-CM | POA: Insufficient documentation

## 2014-12-03 NOTE — Progress Notes (Signed)
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 She goes to dialysis but does not regularly see a nephrologist. Dr. Kathrene Bongo is in charge of the dialysis clinic and referred her for stress testing prior to possible kidney transplant at [facility]. She has no cardiac history. She apparently had an echocardiogram in the distant past which was normal. She's never had any stress testing. She denies any chest pain or shortness of breath. She apparently has had cholesterol testing in the past she is not on medication for this. When I asked her if she has a rheumatologist for her lupus she reported that she did not. Apparently her CellCept, tacrolimus and prednisone are managed by her nephrologist.  I saw [name] [name] back today in the office. She has recently seen a nephrologist at dialysis, but at this time it was Dr. Marisue Humble. He had a discussion with her and she is now willing to consider cardiac catheterization. This is based on the findings of her recent studies. She did undergo echocardiogram which showed a marked cardiomyopathy with EF of 20-25%, diffuse hypokinesis and elevated filling pressures. She also had a nuclear stress test performed which showed a large reversible anterior defect and fixed in her and was read as high risk. LVEF was 29%. I had a long discussion with her about what needs to happen in order for her to get better. There is concern of course for underlying coronary artery disease, potentially even surgical coronary artery disease. I'm recommending heart catheterization, particularly left and right heart catheterization. We will try to schedule her later this week with one of my partners as I have no availability to do catheterization this week.  PMHx:  Past Medical History    Diagnosis Date  . Chronic kidney disease     s/p right kidney transplant  . Lupus     Past Surgical History  Procedure Laterality Date  . Cesarean section    . Kidney transplant  right kidney    FAMHx:  Family History  Problem Relation Age of Onset  . Hypertension Mother   . Cancer Maternal Aunt   . Cancer Maternal Uncle     SOCHx:   reports that she has never smoked. She does not have any smokeless tobacco history on file. She reports that she does not drink alcohol or use illicit drugs.  ALLERGIES:  Allergies  Allergen Reactions  . Ancef [Cefazolin] Itching    ROS: A comprehensive review of systems was negative.  HOME MEDS: Current Outpatient Prescriptions  Medication Sig Dispense Refill  . mycophenolate (CELLCEPT) 500 MG tablet Take 500 mg by mouth 2 (two) times daily.    . predniSONE (DELTASONE) 10 MG tablet Take 10 mg by mouth daily with breakfast.    . tacrolimus (PROGRAF) 5 MG capsule Take 5 mg by mouth 2 (two) times daily.     No current facility-administered medications for this visit.    LABS/IMAGING: No results found for this or any previous visit (from the past 48 hour(s)). No results found.  WEIGHTS: Wt Readings from Last 3 Encounters:  12/03/14 104 lb (47.174 kg)  11/18/14 104 lb (47.174 kg)  10/29/14 104 lb 4.8 oz (47.31 kg)    VITALS: BP 82/68 mmHg  Pulse 89  Ht 5\' 4"  (1.626 m)  Wt 104 lb (47.174 kg)  BMI 17.84 kg/m2  EXAM: Deferred  EKG: Normal sinus rhythm at 90, biatrial enlargement, T-wave abnormalities laterally concerning for ischemia  ASSESSMENT: 1. 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 Risk nuclear stress test-LVEF 29%, with reversible anterior and fixed inferolateral defects 3. End-stage renal disease on hemodialysis (Tuesday, Thursday, Saturday) 4. History of lupus nephritis 5. SLE 6. Abnormal EKG  PLAN: 1.   [name] [name] is desiring a kidney transplant. Unfortunately, her echocardiogram was markedly  abnormal demonstrating a cardiomyopathy with global hypokinesis and low EF of 20-25%. Her nuclear stress test was also high risk indicating a low EF with reversible and fixed defects. There is concern for underlying coronary artery disease. I'm recommending right and left heart catheterization. She's discussed with her nephrologist and he is supportive of her undergoing cardiac catheterization. She wishes to have that as soon as possible. I will schedule that for her later this week although it will be with one of my partners as I have no availability to do cardiac catheterization this week.  Plan to see her back in follow-up after her catheterization.  She will ultimately need to be started on heart failure medications, however her blood pressure at this time precludes starting any of the typical heart failure medications.  Vail Vuncannon C. Tiandra Swoveland, MD, FACC Attending Cardiologist [facility]  Shawan Tosh C Novice Vrba 12/03/2014, 7:02 PM  

## 2014-12-03 NOTE — Patient Instructions (Signed)
Your physician has requested that you have a cardiac catheterization @ [facility] ASAP. Cardiac catheterization is used to diagnose and/or treat various heart conditions. Doctors may recommend this procedure for a number of different reasons. The most common reason is to evaluate chest pain. Chest pain can be a symptom of coronary artery disease (CAD), and cardiac catheterization can show whether plaque is narrowing or blocking your heart's arteries. This procedure is also used to evaluate the valves, as well as measure the blood flow and oxygen levels in different parts of your heart. For further information please visit [email or web address] Please follow instruction sheet, as given.  Following your catheterization, you will not be allowed to drive for 3 days.  No lifting, pushing, or pulling greater that 10 pounds is allowed for 1 week.  You will be required to have the following tests prior to the procedure:  1. Blood work - the blood work can be done no more than 7 days prior to the procedure.  It can be done at any [facility] lab. There is a lab downstairs on the first floor of this building in [address] and one at [address].  2. Chest X-ray - this can be done at [facility] in the [facility] Building @ [address]

## 2014-12-04 ENCOUNTER — Encounter (HOSPITAL_COMMUNITY)
Admission: RE | Disposition: A | Discharge status: Home / Self Care | Payer: Self-pay | Source: Ambulatory Visit | Attending: Interventional Cardiology

## 2014-12-04 ENCOUNTER — Ambulatory Visit (HOSPITAL_COMMUNITY)
Admission: RE | Admit: 2014-12-04 | Discharge: 2014-12-04 | Disposition: A | Discharge status: Home / Self Care | Payer: 59 | Source: Ambulatory Visit | Attending: Interventional Cardiology | Admitting: Interventional Cardiology

## 2014-12-04 ENCOUNTER — Ambulatory Visit (HOSPITAL_COMMUNITY): Payer: 59

## 2014-12-04 DIAGNOSIS — M3214 Glomerular disease in systemic lupus erythematosus: Secondary | ICD-10-CM | POA: Insufficient documentation

## 2014-12-04 DIAGNOSIS — N186 End stage renal disease: Secondary | ICD-10-CM | POA: Diagnosis not present

## 2014-12-04 DIAGNOSIS — R9439 Abnormal result of other cardiovascular function study: Secondary | ICD-10-CM | POA: Diagnosis not present

## 2014-12-04 DIAGNOSIS — I519 Heart disease, unspecified: Secondary | ICD-10-CM | POA: Insufficient documentation

## 2014-12-04 DIAGNOSIS — Z940 Kidney transplant status: Secondary | ICD-10-CM | POA: Insufficient documentation

## 2014-12-04 DIAGNOSIS — Z992 Dependence on renal dialysis: Secondary | ICD-10-CM | POA: Diagnosis not present

## 2014-12-04 DIAGNOSIS — Z01818 Encounter for other preprocedural examination: Secondary | ICD-10-CM

## 2014-12-04 DIAGNOSIS — I428 Other cardiomyopathies: Secondary | ICD-10-CM | POA: Insufficient documentation

## 2014-12-04 HISTORY — PX: CARDIAC CATHETERIZATION: SHX172

## 2014-12-04 LAB — BASIC METABOLIC PANEL
Anion gap: 10 (ref 5–15)
BUN: 22 mg/dL — AB (ref 6–20)
CALCIUM: 8.7 mg/dL — AB (ref 8.9–10.3)
CHLORIDE: 94 mmol/L — AB (ref 101–111)
CO2: 31 mmol/L (ref 22–32)
Creatinine, Ser: 7.34 mg/dL — ABNORMAL HIGH (ref 0.44–1.00)
GFR, EST AFRICAN AMERICAN: 7 mL/min — AB (ref >60)
GFR, EST NON AFRICAN AMERICAN: 6 mL/min — AB (ref >60)
Glucose, Bld: 88 mg/dL (ref 65–99)
Potassium: 4.6 mmol/L (ref 3.5–5.1)
SODIUM: 135 mmol/L (ref 135–145)

## 2014-12-04 LAB — CBC
HCT: 34.8 % — ABNORMAL LOW (ref 36.0–46.0)
Hemoglobin: 10.3 g/dL — ABNORMAL LOW (ref 12.0–15.0)
MCH: 25.4 pg — AB (ref 26.0–34.0)
MCHC: 29.6 g/dL — ABNORMAL LOW (ref 30.0–36.0)
MCV: 85.7 fL (ref 78.0–100.0)
Platelets: 232 10*3/uL (ref 150–400)
RBC: 4.06 MIL/uL (ref 3.87–5.11)
RDW: 17.9 % — ABNORMAL HIGH (ref 11.5–15.5)
WBC: 8.3 10*3/uL (ref 4.0–10.5)

## 2014-12-04 LAB — PROTIME-INR
INR: 1.11 (ref 0.00–1.49)
Prothrombin Time: 14.5 seconds (ref 11.6–15.2)

## 2014-12-04 LAB — HCG, SERUM, QUALITATIVE: Preg, Serum: NEGATIVE

## 2014-12-04 LAB — TSH: TSH: 0.982 u[IU]/mL (ref 0.350–4.500)

## 2014-12-04 SURGERY — RIGHT/LEFT HEART CATH AND CORONARY ANGIOGRAPHY
Anesthesia: LOCAL

## 2014-12-04 MED ORDER — SODIUM CHLORIDE 0.9 % IV SOLN: 250.0000 mL | INTRAVENOUS | Status: DC | PRN

## 2014-12-04 MED ORDER — MIDAZOLAM HCL 2 MG/2ML IJ SOLN
INTRAMUSCULAR | Status: AC
  Filled 2014-12-04: qty 2

## 2014-12-04 MED ORDER — SODIUM CHLORIDE 0.9 % IJ SOLN: 3.0000 mL | INTRAMUSCULAR | Status: DC | PRN

## 2014-12-04 MED ORDER — HEPARIN (PORCINE) IN NACL 2-0.9 UNIT/ML-% IJ SOLN
INTRAMUSCULAR | Status: DC | PRN
  Administered 2014-12-04: 15:00:00

## 2014-12-04 MED ORDER — ASPIRIN 81 MG PO CHEW
81.0000 mg | CHEWABLE_TABLET | ORAL | Status: AC
  Administered 2014-12-04: 81 mg via ORAL

## 2014-12-04 MED ORDER — HEPARIN (PORCINE) IN NACL 2-0.9 UNIT/ML-% IJ SOLN
INTRAMUSCULAR | Status: AC
  Filled 2014-12-04: qty 1000

## 2014-12-04 MED ORDER — FENTANYL CITRATE (PF) 100 MCG/2ML IJ SOLN
INTRAMUSCULAR | Status: AC
  Filled 2014-12-04: qty 2

## 2014-12-04 MED ORDER — SODIUM CHLORIDE 0.9 % IJ SOLN: 3.0000 mL | Freq: Two times a day (BID) | INTRAMUSCULAR | Status: DC

## 2014-12-04 MED ORDER — NITROGLYCERIN 1 MG/10 ML FOR IR/CATH LAB
INTRA_ARTERIAL | Status: AC
Start: 2014-12-04 — End: 2014-12-04
  Filled 2014-12-04: qty 10

## 2014-12-04 MED ORDER — ASPIRIN 81 MG PO CHEW
CHEWABLE_TABLET | ORAL | Status: AC
  Administered 2014-12-04: 81 mg via ORAL
  Filled 2014-12-04: qty 1

## 2014-12-04 MED ORDER — MIDAZOLAM HCL 2 MG/2ML IJ SOLN
INTRAMUSCULAR | Status: DC | PRN
  Administered 2014-12-04 (×2): 1 mg via INTRAVENOUS

## 2014-12-04 MED ORDER — IOHEXOL 350 MG/ML SOLN
INTRAVENOUS | Status: DC | PRN
  Administered 2014-12-04: 40 mL via INTRACARDIAC

## 2014-12-04 MED ORDER — SODIUM CHLORIDE 0.9 % IV SOLN: INTRAVENOUS | Status: DC

## 2014-12-04 MED ORDER — FENTANYL CITRATE (PF) 100 MCG/2ML IJ SOLN
INTRAMUSCULAR | Status: DC | PRN
  Administered 2014-12-04 (×2): 25 ug via INTRAVENOUS

## 2014-12-04 MED ORDER — LIDOCAINE HCL (PF) 1 % IJ SOLN
INTRAMUSCULAR | Status: AC
  Filled 2014-12-04: qty 30

## 2014-12-04 SURGICAL SUPPLY — 9 items
CATH INFINITI 5FR MULTPACK ANG (CATHETERS) ×1 IMPLANT
CATH SWAN GANZ 7F STRAIGHT (CATHETERS) ×1 IMPLANT
KIT HEART LEFT (KITS) ×2 IMPLANT
PACK CARDIAC CATHETERIZATION (CUSTOM PROCEDURE TRAY) ×2 IMPLANT
SHEATH PINNACLE 5F 10CM (SHEATH) ×1 IMPLANT
SHEATH PINNACLE 7F 10CM (SHEATH) ×1 IMPLANT
SYR MEDRAD MARK V 150ML (SYRINGE) ×2 IMPLANT
TRANSDUCER W/STOPCOCK (MISCELLANEOUS) ×3 IMPLANT
WIRE EMERALD 3MM-J .035X150CM (WIRE) ×1 IMPLANT

## 2014-12-04 NOTE — Progress Notes (Signed)
Site area: right groin a 5 french atrerial and a 7 french venous sheath was removed  Site Prior to Removal:  Level 0  Pressure Applied For 20 MINUTES    Minutes Beginning at 1530p  Manual:   Yes.    Patient Status During Pull:  Stable  Post Pull Groin Site:  Level 0  Post Pull Instructions Given:  Yes.    Post Pull Pulses Present:  Yes.    Dressing Applied:  Yes.    Comments:  VS remain stable during sheath pull.  Pt denies any discomfort at site at this time.

## 2014-12-04 NOTE — H&P (View-Only) (Signed)
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 She goes to dialysis but does not regularly see a nephrologist. Dr. Kathrene Bongo is in charge of the dialysis clinic and referred her for stress testing prior to possible kidney transplant at [facility]. She has no cardiac history. She apparently had an echocardiogram in the distant past which was normal. She's never had any stress testing. She denies any chest pain or shortness of breath. She apparently has had cholesterol testing in the past she is not on medication for this. When I asked her if she has a rheumatologist for her lupus she reported that she did not. Apparently her CellCept, tacrolimus and prednisone are managed by her nephrologist.  I saw [name] [name] back today in the office. She has recently seen a nephrologist at dialysis, but at this time it was Dr. Marisue Humble. He had a discussion with her and she is now willing to consider cardiac catheterization. This is based on the findings of her recent studies. She did undergo echocardiogram which showed a marked cardiomyopathy with EF of 20-25%, diffuse hypokinesis and elevated filling pressures. She also had a nuclear stress test performed which showed a large reversible anterior defect and fixed in her and was read as high risk. LVEF was 29%. I had a long discussion with her about what needs to happen in order for her to get better. There is concern of course for underlying coronary artery disease, potentially even surgical coronary artery disease. I'm recommending heart catheterization, particularly left and right heart catheterization. We will try to schedule her later this week with one of my partners as I have no availability to do catheterization this week.  PMHx:  Past Medical History    Diagnosis Date  . Chronic kidney disease     s/p right kidney transplant  . Lupus     Past Surgical History  Procedure Laterality Date  . Cesarean section    . Kidney transplant  right kidney    FAMHx:  Family History  Problem Relation Age of Onset  . Hypertension Mother   . Cancer Maternal Aunt   . Cancer Maternal Uncle     SOCHx:   reports that she has never smoked. She does not have any smokeless tobacco history on file. She reports that she does not drink alcohol or use illicit drugs.  ALLERGIES:  Allergies  Allergen Reactions  . Ancef [Cefazolin] Itching    ROS: A comprehensive review of systems was negative.  HOME MEDS: Current Outpatient Prescriptions  Medication Sig Dispense Refill  . mycophenolate (CELLCEPT) 500 MG tablet Take 500 mg by mouth 2 (two) times daily.    . predniSONE (DELTASONE) 10 MG tablet Take 10 mg by mouth daily with breakfast.    . tacrolimus (PROGRAF) 5 MG capsule Take 5 mg by mouth 2 (two) times daily.     No current facility-administered medications for this visit.    LABS/IMAGING: No results found for this or any previous visit (from the past 48 hour(s)). No results found.  WEIGHTS: Wt Readings from Last 3 Encounters:  12/03/14 104 lb (47.174 kg)  11/18/14 104 lb (47.174 kg)  10/29/14 104 lb 4.8 oz (47.31 kg)    VITALS: BP 82/68 mmHg  Pulse 89  Ht 5\' 4"  (1.626 m)  Wt 104 lb (47.174 kg)  BMI 17.84 kg/m2  EXAM: Deferred  EKG: Normal sinus rhythm at 90, biatrial enlargement, T-wave abnormalities laterally concerning for ischemia  ASSESSMENT: 1. 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 Risk nuclear stress test-LVEF 29%, with reversible anterior and fixed inferolateral defects 3. End-stage renal disease on hemodialysis (Tuesday, Thursday, Saturday) 4. History of lupus nephritis 5. SLE 6. Abnormal EKG  PLAN: 1.   [name] [name] is desiring a kidney transplant. Unfortunately, her echocardiogram was markedly  abnormal demonstrating a cardiomyopathy with global hypokinesis and low EF of 20-25%. Her nuclear stress test was also high risk indicating a low EF with reversible and fixed defects. There is concern for underlying coronary artery disease. I'm recommending right and left heart catheterization. She's discussed with her nephrologist and he is supportive of her undergoing cardiac catheterization. She wishes to have that as soon as possible. I will schedule that for her later this week although it will be with one of my partners as I have no availability to do cardiac catheterization this week.  Plan to see her back in follow-up after her catheterization.  She will ultimately need to be started on heart failure medications, however her blood pressure at this time precludes starting any of the typical heart failure medications.  Chrystie Nose, MD, [facility] Attending Cardiologist [facility]  [name] [name] 12/03/2014, 7:02 PM

## 2014-12-04 NOTE — Discharge Instructions (Signed)

## 2014-12-04 NOTE — Interval H&P Note (Signed)
Cath Lab Visit (complete for each Cath Lab visit)  Clinical Evaluation Leading to the Procedure:   ACS: No.  Non-ACS:    Anginal Classification: CCS II  Anti-ischemic medical therapy: Minimal Therapy (1 class of medications)  Non-Invasive Test Results: High-risk stress test findings: cardiac mortality >3%/year  Prior CABG: No previous CABG   Ischemic Symptoms? CCS II (Slight limitation of ordinary activity) Anti-ischemic Medical Therapy? Minimal Therapy (1 class of medications) Non-invasive Test Results? High-risk stress test findings: cardiac mortality >3%/yr Prior CABG? No Previous CABG   Patient Information:   1-2V CAD, no prox LAD  A (7)  Indication: 18; Score: 7   Patient Information:   CTO of 1 vessel, no other CAD  U (5)  Indication: 28; Score: 5   Patient Information:   1V CAD with prox LAD  A (8)  Indication: 34; Score: 8   Patient Information:   2V-CAD with prox LAD  A (8)  Indication: 40; Score: 8   Patient Information:   3V-CAD without LMCA  A (8)  Indication: 46; Score: 8   Patient Information:   3V-CAD without LMCA With Abnormal LV systolic function  A (9)  Indication: 48; Score: 9   Patient Information:   LMCA-CAD  A (9)  Indication: 49; Score: 9   Patient Information:   2V-CAD with prox LAD PCI  A (7)  Indication: 62; Score: 7   Patient Information:   2V-CAD with prox LAD CABG  A (8)  Indication: 62; Score: 8   Patient Information:   3V-CAD without LMCA With Low CAD burden(i.e., 3 focal stenoses, low SYNTAX score) PCI  A (7)  Indication: 63; Score: 7   Patient Information:   3V-CAD without LMCA With Low CAD burden(i.e., 3 focal stenoses, low SYNTAX score) CABG  A (9)  Indication: 63; Score: 9   Patient Information:   3V-CAD without LMCA E06c - Intermediate-high CAD burden (i.e., multiple diffuse lesions, presence of CTO, or high SYNTAX score) PCI  U (4)  Indication: 64; Score:  4   Patient Information:   3V-CAD without LMCA E06c - Intermediate-high CAD burden (i.e., multiple diffuse lesions, presence of CTO, or high SYNTAX score) CABG  A (9)  Indication: 64; Score: 9   Patient Information:   LMCA-CAD With Isolated LMCA stenosis  PCI  U (6)  Indication: 65; Score: 6   Patient Information:   LMCA-CAD With Isolated LMCA stenosis  CABG  A (9)  Indication: 65; Score: 9   Patient Information:   LMCA-CAD Additional CAD, low CAD burden (i.e., 1- to 2-vessel additional involvement, low SYNTAX score) PCI  U (5)  Indication: 66; Score: 5   Patient Information:   LMCA-CAD Additional CAD, low CAD burden (i.e., 1- to 2-vessel additional involvement, low SYNTAX score) CABG  A (9)  Indication: 66; Score: 9   Patient Information:   LMCA-CAD Additional CAD, intermediate-high CAD burden (i.e., 3-vessel involvement, presence of CTO, or high SYNTAX score) PCI  I (3)  Indication: 67; Score: 3   Patient Information:   LMCA-CAD Additional CAD, intermediate-high CAD burden (i.e., 3-vessel involvement, presence of CTO, or high SYNTAX score) CABG  A (9)  Indication: 67; Score: 9    History and Physical Interval Note:  12/04/2014 2:40 PM  Eliabeth A Depaula  has presented today for surgery, with the diagnosis of abnormal stress test  The various methods of treatment have been discussed with the patient and family. After consideration of risks, benefits and other options  for treatment, the patient has consented to  Procedure(s): Right/Left Heart Cath and Coronary Angiography (N/A) as a surgical intervention .  The patient's history has been reviewed, patient examined, no change in status, stable for surgery.  I have reviewed the patient's chart and labs.  Questions were answered to the patient's satisfaction.     Leeanne Butters S.

## 2014-12-05 ENCOUNTER — Encounter (HOSPITAL_COMMUNITY): Payer: Self-pay | Admitting: Interventional Cardiology

## 2014-12-05 ENCOUNTER — Telehealth: Payer: Self-pay | Admitting: Internal Medicine

## 2014-12-05 LAB — POCT I-STAT 3, VENOUS BLOOD GAS (G3P V)
Acid-Base Excess: 4 mmol/L — ABNORMAL HIGH (ref 0.0–2.0)
BICARBONATE: 30 meq/L — AB (ref 20.0–24.0)
O2 Saturation: 72 %
PO2 VEN: 38 mmHg (ref 30.0–45.0)
TCO2: 31 mmol/L (ref 0–100)
pCO2, Ven: 48.3 mmHg (ref 45.0–50.0)
pH, Ven: 7.401 — ABNORMAL HIGH (ref 7.250–7.300)

## 2014-12-05 LAB — POCT I-STAT 3, ART BLOOD GAS (G3+)
Acid-Base Excess: 5 mmol/L — ABNORMAL HIGH (ref 0.0–2.0)
Bicarbonate: 30.3 mEq/L — ABNORMAL HIGH (ref 20.0–24.0)
O2 SAT: 93 %
PCO2 ART: 46.7 mmHg — AB (ref 35.0–45.0)
PO2 ART: 68 mmHg — AB (ref 80.0–100.0)
TCO2: 32 mmol/L (ref 0–100)
pH, Arterial: 7.42 (ref 7.350–7.450)

## 2014-12-05 NOTE — Telephone Encounter (Signed)
Pt. Stated she wants to know when she needs to come in to see Dr. Rennis Golden , pt. Informed that  She didn't need to see Dr. Rennis Golden that it was ok for her to see a PA or NP, Pt. Stated understanding of instructons

## 2014-12-05 NOTE — Telephone Encounter (Signed)
Pt had a Cath yesterday,she wants to know when can she come in for a follow up visit?

## 2014-12-30 ENCOUNTER — Encounter: Payer: Self-pay | Admitting: Internal Medicine

## 2014-12-30 ENCOUNTER — Ambulatory Visit (INDEPENDENT_AMBULATORY_CARE_PROVIDER_SITE_OTHER): Payer: 59 | Admitting: Internal Medicine

## 2014-12-30 VITALS — BP 100/78 | HR 80 | Ht 64.0 in | Wt 111.1 lb

## 2014-12-30 DIAGNOSIS — Z01810 Encounter for preprocedural cardiovascular examination: Secondary | ICD-10-CM | POA: Diagnosis not present

## 2014-12-30 DIAGNOSIS — M329 Systemic lupus erythematosus, unspecified: Secondary | ICD-10-CM

## 2014-12-30 DIAGNOSIS — I429 Cardiomyopathy, unspecified: Secondary | ICD-10-CM | POA: Diagnosis not present

## 2014-12-30 DIAGNOSIS — I428 Other cardiomyopathies: Secondary | ICD-10-CM

## 2014-12-30 DIAGNOSIS — N186 End stage renal disease: Secondary | ICD-10-CM

## 2014-12-30 DIAGNOSIS — Z992 Dependence on renal dialysis: Secondary | ICD-10-CM

## 2014-12-30 NOTE — Patient Instructions (Addendum)
Your physician wants you to follow-up in: 6 months with Dr. Rennis Golden. You will receive a reminder letter in the mail two months in advance. If you don't receive a letter, please call our office to schedule the follow-up appointment.  Your physician has requested that you have an echocardiogram in 6 months @ [address]. Echocardiography is a painless test that uses sound waves to create images of your heart. It provides your doctor with information about the size and shape of your heart and how well your heart's chambers and valves are working. This procedure takes approximately one hour. There are no restrictions for this procedure.   Please call to inform us what your dose of carvedilol is - [phone]  Your physician wants you to follow-up in: 6 months with Dr. Rennis Golden. You will receive a reminder letter in the mail two months in advance. If you don't receive a letter, please call our office to schedule the follow-up appointment.

## 2014-12-30 NOTE — Progress Notes (Signed)
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 She goes to dialysis but does not regularly see a nephrologist. Dr. Kathrene Bongo is in charge of the dialysis clinic and referred her for stress testing prior to possible kidney transplant at [facility]. She has no cardiac history. She apparently had an echocardiogram in the distant past which was normal. She's never had any stress testing. She denies any chest pain or shortness of breath. She apparently has had cholesterol testing in the past she is not on medication for this. When I asked her if she has a rheumatologist for her lupus she reported that she did not. Apparently her CellCept, tacrolimus and prednisone are managed by her nephrologist.  I saw [name] [name] back today in the office. She has recently seen a nephrologist at dialysis, but at this time it was Dr. Marisue Humble. He had a discussion with her and she is now willing to consider cardiac catheterization. This is based on the findings of her recent studies. She did undergo echocardiogram which showed a marked cardiomyopathy with EF of 20-25%, diffuse hypokinesis and elevated filling pressures. She also had a nuclear stress test performed which showed a large reversible anterior defect and fixed in her and was read as high risk. LVEF was 29%. I had a long discussion with her about what needs to happen in order for her to get better. There is concern of course for underlying coronary artery disease, potentially even surgical coronary artery disease. I'm recommending heart catheterization, particularly left and right heart catheterization. We will try to schedule her later this week with one of my partners as I have no availability to do catheterization this week.  [name] [name] returns today for follow-up of her  heart catheterization. This demonstrated normal coronary arteries with well compensated filling pressures and mild to moderately decreased cardiac output. EF was estimated to be around 40%., Which is somewhat better than what was reported by echo. Her nephrologist started her on a low-dose of what I think is carvedilol although she's not clear on the dose of the medicine I believe is 3.125 mg twice a day.  PMHx:  Past Medical History  Diagnosis Date  . Chronic kidney disease     s/p right kidney transplant  . Lupus     Past Surgical History  Procedure Laterality Date  . Cesarean section    . Kidney transplant  right kidney  . Cardiac catheterization N/A 12/04/2014    Procedure: Right/Left Heart Cath and Coronary Angiography;  Surgeon: Corky Crafts, MD;  Location: [facility] INVASIVE CV LAB;  Service: Cardiovascular;  Laterality: N/A;    FAMHx:  Family History  Problem Relation Age of Onset  . Hypertension Mother   . Cancer Maternal Aunt   . Cancer Maternal Uncle     SOCHx:   reports that she has never smoked. She does not have any smokeless tobacco history on file. She reports that she does not drink alcohol or use illicit drugs.  ALLERGIES:  Allergies  Allergen Reactions  . Ancef [Cefazolin] Itching    ROS: A comprehensive review of systems was negative.  HOME MEDS: Current Outpatient Prescriptions  Medication Sig Dispense Refill  . carvedilol (COREG) 3.125 MG tablet Take 3.125 mg by mouth 2 (two) times daily with a meal.    .  mycophenolate (CELLCEPT) 500 MG tablet Take 500 mg by mouth 2 (two) times daily.    . predniSONE (DELTASONE) 10 MG tablet Take 10 mg by mouth daily with breakfast.    . tacrolimus (PROGRAF) 5 MG capsule Take 5 mg by mouth 2 (two) times daily.     No current facility-administered medications for this visit.    LABS/IMAGING: No results found for this or any previous visit (from the past 48 hour(s)). No results found.  WEIGHTS: Wt Readings from  Last 3 Encounters:  12/30/14 111 lb 1.6 oz (50.395 kg)  12/04/14 106 lb (48.081 kg)  12/03/14 104 lb (47.174 kg)    VITALS: BP 100/78 mmHg  Pulse 80  Ht 5\' 4"  (1.626 m)  Wt 111 lb 1.6 oz (50.395 kg)  BMI 19.06 kg/m2  LMP 11/05/2014  EXAM: Deferred  EKG: Deferred  ASSESSMENT: 1. 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 Risk nuclear stress test-LVEF 29%, with reversible anterior and fixed inferolateral defects 3. End-stage renal disease on hemodialysis (Tuesday, Thursday, Saturday) 4. History of lupus nephritis 5. SLE 6. Intermediate risk for renal transplant surgery  PLAN: 1.   [name] [name] is likely at intermediate risk for transplant surgery. She does have normal coronary arteries by catheterization but some degree of cardiomyopathy. Her EF is very between 25% and up to 40% based on a recent catheterization, which could suggest some improvement. She's been started on low-dose beta blocker and given her renal disease, I would not recommended an ACE inhibitor or ARB. The most likely etiology of her cardiomyopathy is long-standing lupus. I recommend a repeat echocardiogram in about 6 months. Volume status seems to be well-controlled with dialysis. We will need to continue to up titrate her carvedilol as blood pressure tolerates.  Plan to see her back in 6 months.  Chrystie Nose, MD, [facility] Attending Cardiologist [facility]  Chrystie Nose 12/30/2014, 4:59 PM

## 2016-04-25 ENCOUNTER — Ambulatory Visit (INDEPENDENT_AMBULATORY_CARE_PROVIDER_SITE_OTHER): Payer: 59 | Admitting: Physician Assistant

## 2016-04-25 VITALS — BP 110/80 | HR 101 | Temp 99.5°F | Resp 18 | Ht 64.0 in | Wt 107.0 lb

## 2016-04-25 DIAGNOSIS — K122 Cellulitis and abscess of mouth: Secondary | ICD-10-CM

## 2016-04-25 DIAGNOSIS — K0889 Other specified disorders of teeth and supporting structures: Secondary | ICD-10-CM | POA: Diagnosis not present

## 2016-04-25 LAB — POCT CBC
Granulocyte percent: 75.4 %G (ref 37–80)
HCT, POC: 30.7 % — AB (ref 37.7–47.9)
Hemoglobin: 9.9 g/dL — AB (ref 12.2–16.2)
Lymph, poc: 1.2 (ref 0.6–3.4)
MCH, POC: 27.5 pg (ref 27–31.2)
MCHC: 32.3 g/dL (ref 31.8–35.4)
MCV: 85.2 fL (ref 80–97)
MID (cbc): 0.3 (ref 0–0.9)
MPV: 7.7 fL (ref 0–99.8)
POC Granulocyte: 5.4 (ref 2–6.9)
POC LYMPH PERCENT: 17.8 %L (ref 10–50)
POC MID %: 3.8 %M (ref 0–12)
Platelet Count, POC: 233 10*3/uL (ref 142–424)
RBC: 3.6 M/uL — AB (ref 4.04–5.48)
RDW, POC: 13.8 %
WBC: 5.9 10*3/uL (ref 4.6–10.2)

## 2016-04-25 MED ORDER — CLINDAMYCIN HCL 300 MG PO CAPS: 300.0000 mg | ORAL_CAPSULE | Freq: Four times a day (QID) | ORAL | 0 refills | Status: AC

## 2016-04-25 NOTE — Progress Notes (Signed)
[name] [name]  MRN: [id number] DOB: [birth year]  PCP: Pcp Not In System  Subjective:  Pt is a 40 year old female, PMH lupus, LV dysfunction, Nonischemic cardiomyopathy, end stage renal disease, who presents to clinic for Sinus pain x four days. She woke up in the morning with a tooth ache and the right side. Notes difficulty eating. Tender to touch right cheek, feels heavy and swollen. Clear drainage out of mouth three days ago, did not taste bad.  Notes associated headache and right ear pain.  Transplant patient, took Tylenol and oxycodone for pain. Helped some.  Denies fevers, chills, chest pain, nausea, vomiting.   Review of Systems  Constitutional: Negative for chills, diaphoresis, fatigue and fever.  HENT: Positive for dental problem and sinus pain. Negative for congestion, postnasal drip, rhinorrhea, sinus pressure, sneezing and sore throat.   Respiratory: Negative for cough, chest tightness, shortness of breath and wheezing.   Cardiovascular: Negative for chest pain and palpitations.  Gastrointestinal: Negative for abdominal pain, diarrhea, nausea and vomiting.  Neurological: Negative for weakness, light-headedness and headaches.    Patient Active Problem List   Diagnosis Date Noted  . Abnormal cardiovascular stress test   . LV dysfunction   . Nonischemic cardiomyopathy (HCC) 12/03/2014  . Abnormal nuclear stress test 12/03/2014  . SLE (systemic lupus erythematosus) (HCC) 10/29/2014  . Lupus nephritis (HCC) 10/29/2014  . ESRD (end stage renal disease) on dialysis (HCC) 10/29/2014  . Abnormal EKG 10/29/2014  . Preoperative cardiovascular examination 10/29/2014    Current Outpatient Prescriptions on File Prior to Visit  Medication Sig Dispense Refill  . mycophenolate (CELLCEPT) 500 MG tablet Take 500 mg by mouth 2 (two) times daily.    . tacrolimus (PROGRAF) 5 MG capsule Take 8 mg by mouth every morning.     . carvedilol (COREG) 3.125 MG tablet Take 3.125 mg by mouth  2 (two) times daily with a meal.    . predniSONE (DELTASONE) 10 MG tablet Take 10 mg by mouth daily with breakfast.     No current facility-administered medications on file prior to visit.     Allergies  Allergen Reactions  . Ancef [Cefazolin] Itching     Objective:  BP 110/80   Pulse (!) 101   Temp 99.5 F (37.5 C) (Oral)   Resp 18   Ht 5\' 4"  (1.626 m)   Wt 107 lb (48.5 kg)   LMP 04/23/2016   SpO2 97%   BMI 18.37 kg/m   Physical Exam  Constitutional: She is oriented to person, place, and time and well-developed, well-nourished, and in no distress. No distress.  HENT:  Right Ear: Tympanic membrane normal. No tenderness.  Left Ear: Tympanic membrane normal. No tenderness.  Mouth/Throat: Oropharynx is clear and moist and mucous membranes are normal. Dental abscesses (upper right, superior to and infront of molars. No drainage. TTP. ) present.  Cardiovascular: Normal rate, regular rhythm and normal heart sounds.   Pulmonary/Chest: Effort normal. No respiratory distress.  Lymphadenopathy:    She has no cervical adenopathy.  Neurological: She is alert and oriented to person, place, and time. GCS score is 15.  Skin: Skin is warm and dry.  Psychiatric: Mood, memory, affect and judgment normal.  Vitals reviewed.  Results for orders placed or performed in visit on 04/25/16  POCT CBC  Result Value Ref Range   WBC 5.9 4.6 - 10.2 K/uL   Lymph, poc 1.2 0.6 - 3.4   POC LYMPH PERCENT 17.8 10 - 50 %L  MID (cbc) 0.3 0 - 0.9   POC MID % 3.8 0 - 12 %M   POC Granulocyte 5.4 2 - 6.9   Granulocyte percent 75.4 37 - 80 %G   RBC 3.60 (A) 4.04 - 5.48 M/uL   Hemoglobin 9.9 (A) 12.2 - 16.2 g/dL   HCT, POC 82.930.7 (A) 56.237.7 - 47.9 %   MCV 85.2 80 - 97 fL   MCH, POC 27.5 27 - 31.2 pg   MCHC 32.3 31.8 - 35.4 g/dL   RDW, POC 13.013.8 %   Platelet Count, POC 233 142 - 424 K/uL   MPV 7.7 0 - 99.8 fL    Assessment and Plan :  1. Abscess of mouth 2. Tooth pain - POCT CB - clindamycin (CLEOCIN)  300 MG capsule; Take 1 capsule (300 mg total) by mouth 4 (four) times daily. Take 600mg  on day 1, then as prescribed  Dispense: 38 capsule; Refill: 0 - Encouraged patient to schedule an appointment with her dentist as soon as possible. Present to emergency department if symptoms worsen. She understands and agrees.   Marco CollieWhitney Shreyan Hinz, PA-C  [facility] 04/25/2016 9:56 AM

## 2016-04-25 NOTE — Patient Instructions (Addendum)
  Start antibiotics today. Please take two pills today ... Then four a day for the next nine days. Continue taking your pain medications as needed.  Please make an appointment with your dentist asap. Please go to the emergency department if your symptoms worsen.  Thank you for coming in today. I hope you feel we met your needs.  Feel free to call [facility] if you have any questions or further requests.  Please consider signing up for MyChart if you do not already have it, as this is a great way to communicate with me.  Best,  Whitney McVey, PA-C  IF you received an x-ray today, you will receive an invoice from [facility]. Please contact [facility] at [phone] with questions or concerns regarding your invoice.   IF you received labwork today, you will receive an invoice from [employer]. Please contact [facility] at [phone] with questions or concerns regarding your invoice.   Our billing staff will not be able to assist you with questions regarding bills from these companies.  You will be contacted with the lab results as soon as they are available. The fastest way to get your results is to activate your My Chart account. Instructions are located on the last page of this paperwork. If you have not heard from [place] regarding the results in 2 weeks, please contact this office.

## 2017-02-10 IMAGING — CT CT ABD-PELV W/O CM
3 of 4 series · 11 of 36 positions shown, 18 images · IV contrast (READICAT/WATER)
Comparison: 06/03/2003

CLINICAL DATA: Preop for kidney transplant, history of right kidney
transplant, generalized abdominal pain, history of lupus, chronic
kidney disease

EXAM:
CT ABDOMEN AND PELVIS WITHOUT CONTRAST
TECHNIQUE: Multidetector CT imaging of the abdomen and pelvis was performed
following the standard protocol without IV contrast.

[Series 3: abd/pelvis w/o · axial · non-contrast · 0.58mm/px · z∈[-344,-44]mm · 9 of 76 slices shown, 15 images]
[im 8/76  soft-tissue]
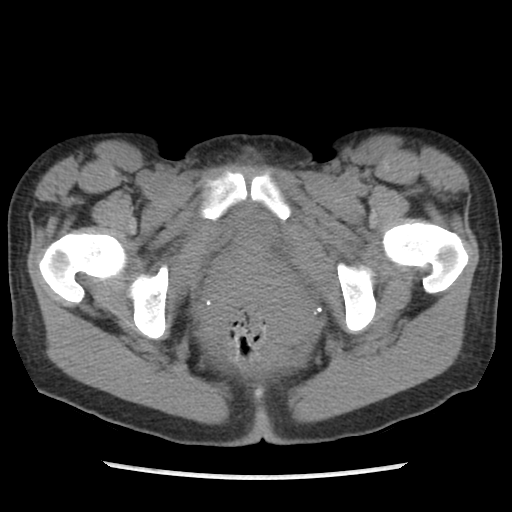
[im 8/76  bone]
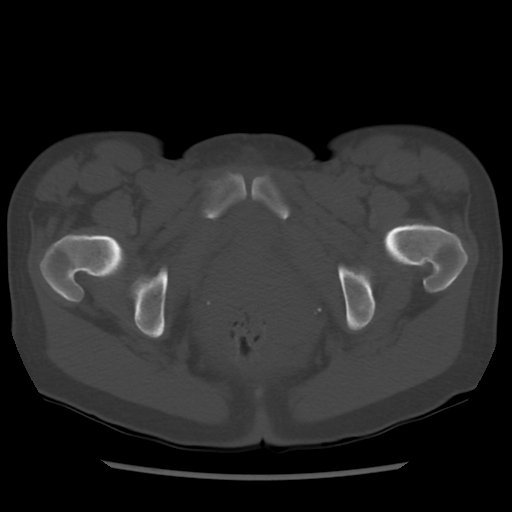
[im 16/76  soft-tissue]
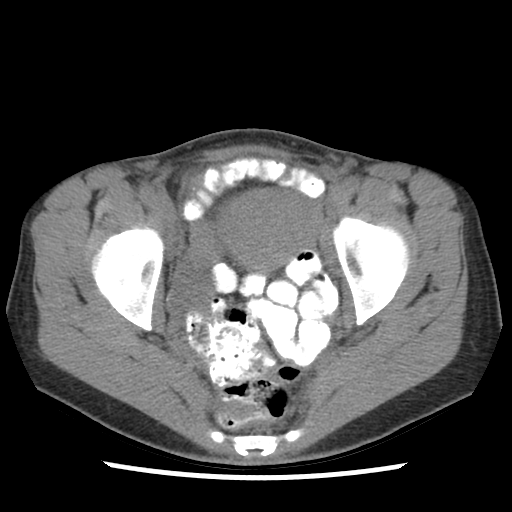
[im 23/76  soft-tissue]
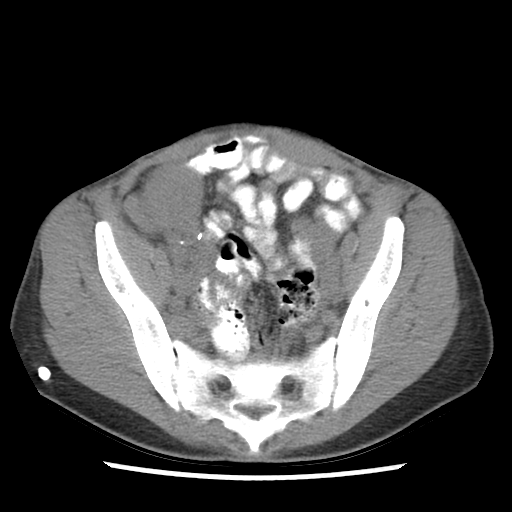
[im 31/76  soft-tissue]
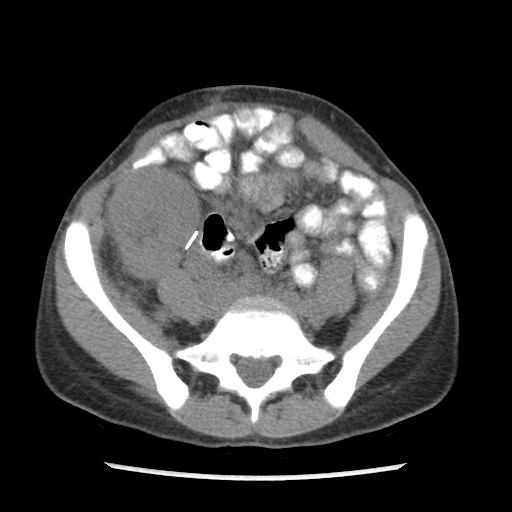
[im 38/76  soft-tissue]
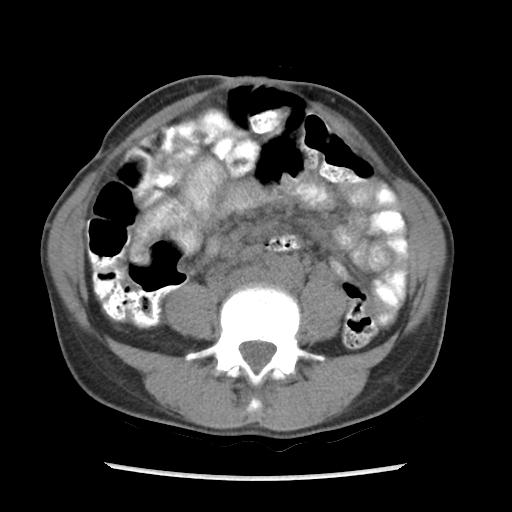
[im 46/76  soft-tissue]
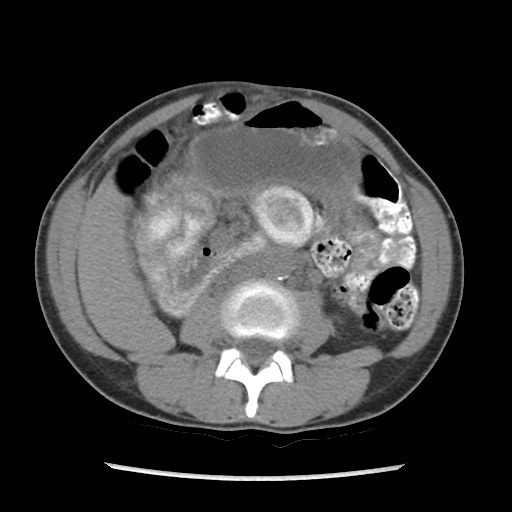
[im 46/76  lung]
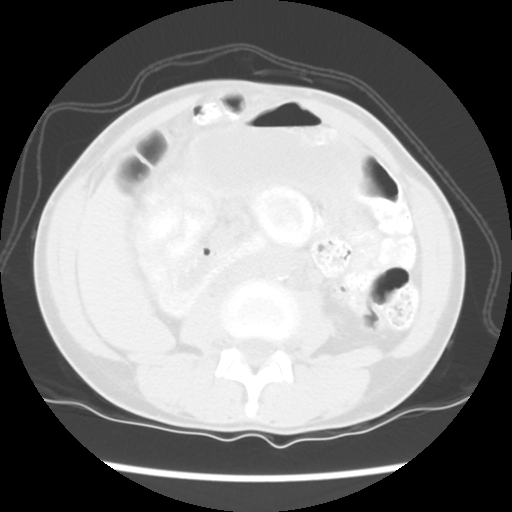
[im 53/76  soft-tissue]
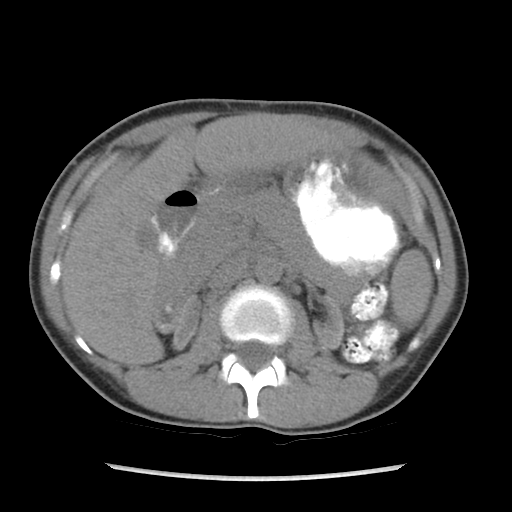
[im 53/76  lung]
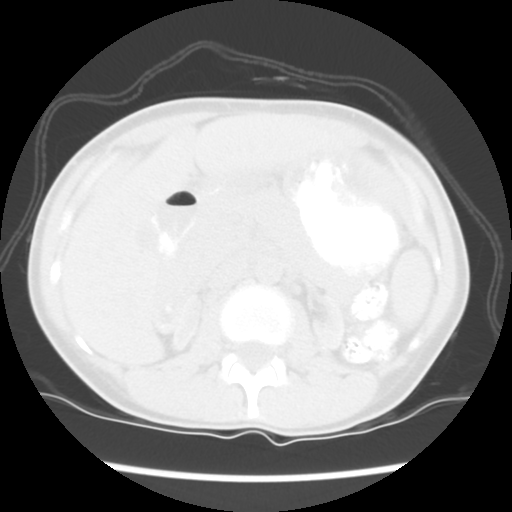
[im 61/76  soft-tissue]
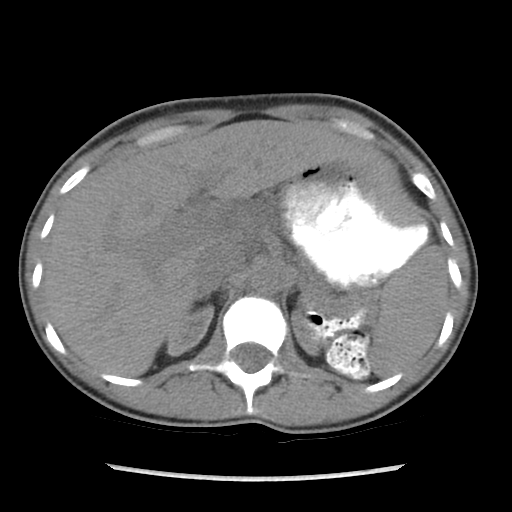
[im 61/76  lung]
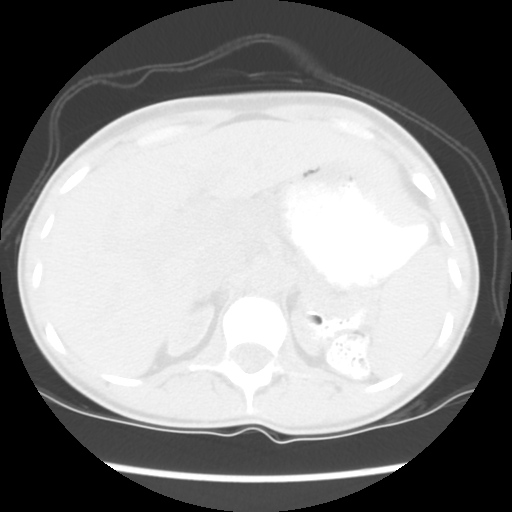
[im 68/76  soft-tissue]
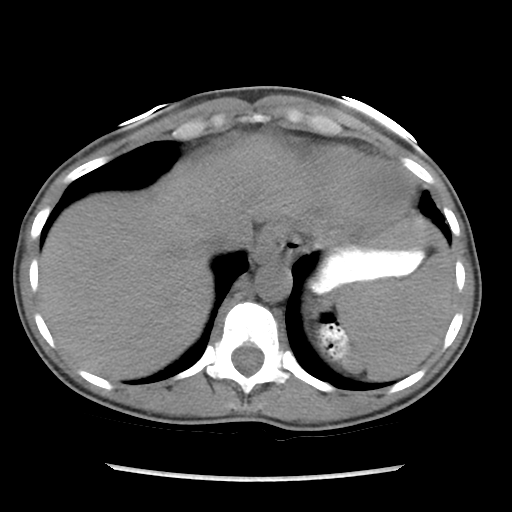
[im 68/76  lung]
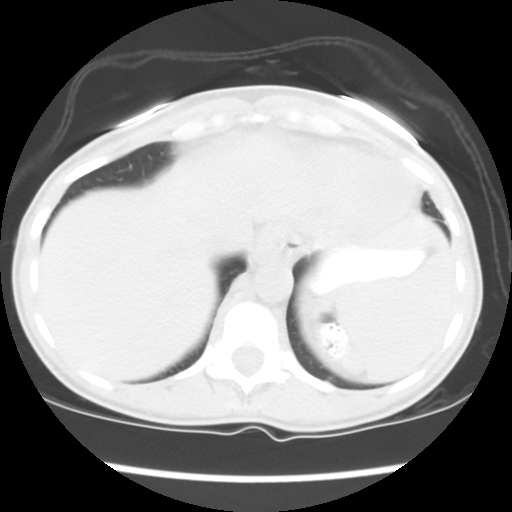
[im 68/76  bone]
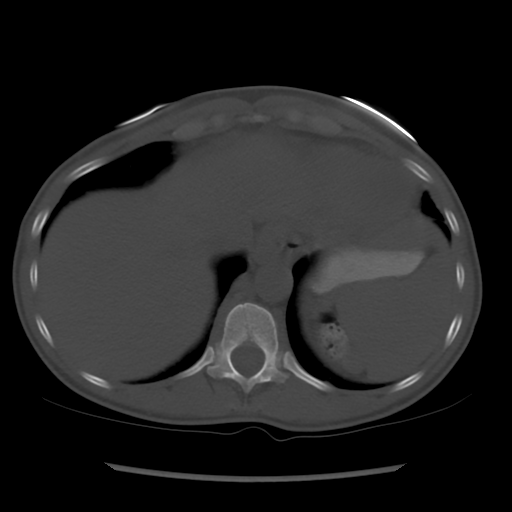

[Series 601: coronal body · coronal · 0.77mm/px · 1 of 100 slices shown, 2 images]
[im 34/100  soft-tissue]
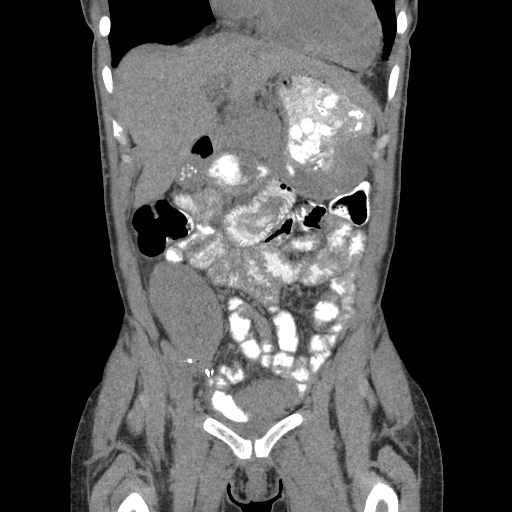
[im 34/100  bone]
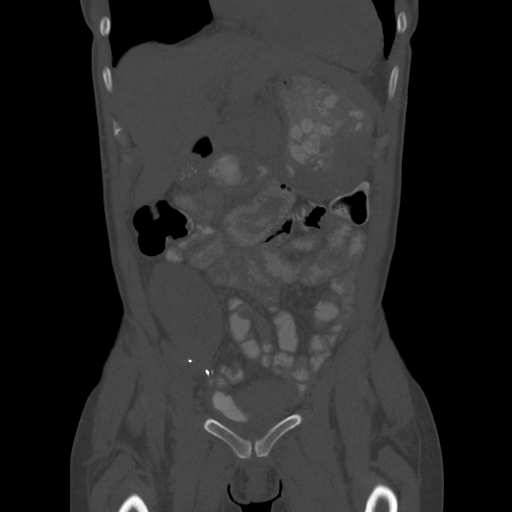

[Series 602: sagittal body · sagittal · 0.77mm/px · 1 of 118 slices shown]
[im 14/118  soft-tissue]
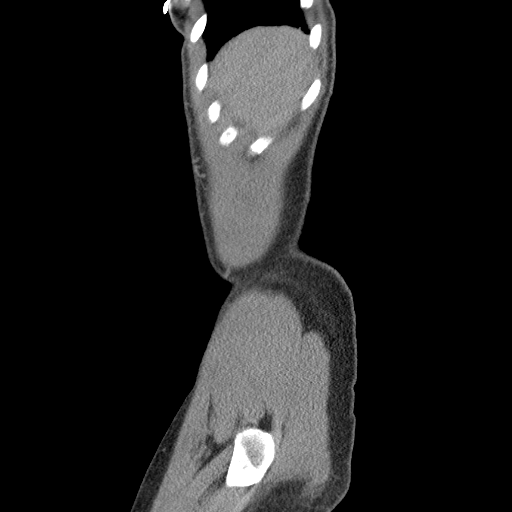

[11 of 36 positions shown; findings below may reference images not displayed]

FINDINGS: Lung bases are unremarkable. Small hiatal hernia is noted. Sagittal
images of the spine are unremarkable.

Unenhanced liver shows no biliary ductal dilatation. Unenhanced
pancreas, spleen and adrenal glands are unremarkable. Bilateral
atrophic kidneys with cortical thinning. The right kidney measures
6.8 cm in length. The left kidney measures 7.9 cm in length. No
nephrolithiasis. No hydronephrosis or hydroureter.

Unenhanced abdominal aorta is unremarkable.

Oral contrast material was given to the patient. No small bowel
obstruction. No gastric outlet obstruction. No ascites or free air.
No aortic aneurysm.

Oral contrast material is noted within right colon. There is a
transplanted kidney in right lower quadrant. No hydronephrosis of
transplanted kidney. There is a low lying cecum with tip in right
posterior cul-de-sac. Normal appendix containing air is noted in
presacral region axial image 52.

There is no pericecal inflammation. No distal colonic obstruction.
Unenhanced uterus is anteflexed. A tiny follicle is noted within
right ovary. No adnexal masses noted.

There is no hydroureter. The urinary bladder is empty limiting its
assessment.
IMPRESSION: 1. Small hiatal hernia is noted.
2. Atrophic native kidneys with significant cortical thinning. No
nephrolithiasis. No hydronephrosis or hydroureter.
3. There is a transplanted kidney in right lower quadrant
anteriorly. No hydronephrosis.
4. No small bowel or colonic obstruction.
5. There is a low lying cecum with tip in right posterior
cul-de-sac. Normal appendix. No pericecal inflammation.
6. Unremarkable uterus and adnexal.

## 2019-08-10 ENCOUNTER — Ambulatory Visit: Payer: 59 | Attending: Internal Medicine

## 2019-08-10 DIAGNOSIS — Z23 Encounter for immunization: Secondary | ICD-10-CM | POA: Insufficient documentation

## 2019-08-10 NOTE — Progress Notes (Signed)
   Covid-19 Vaccination Clinic  Name:  LACHANDA BUCZEK    MRN: [id number] DOB: Jul 04, 1975  08/10/2019  Ms. Egger was observed post Covid-19 immunization for 15 minutes without incident. She was provided with Vaccine Information Sheet and instruction to access the V-Safe system.   Ms. Kasper was instructed to call 911 with any severe reactions post vaccine: Marland Kitchen Difficulty breathing  . Swelling of face and throat  . A fast heartbeat  . A bad rash all over body  . Dizziness and weakness   Immunizations Administered    Name Date Dose VIS Date Route   Pfizer COVID-19 Vaccine 08/10/2019  1:27 PM 0.3 mL 05/17/2019 Intramuscular   Manufacturer: [employer], [employer]   Lot: [id number]   NDC: [address]-4

## 2019-08-28 ENCOUNTER — Ambulatory Visit: Payer: 59 | Attending: Internal Medicine

## 2019-08-28 DIAGNOSIS — Z23 Encounter for immunization: Secondary | ICD-10-CM

## 2019-08-28 NOTE — Progress Notes (Signed)
   Covid-19 Vaccination Clinic  Name:  [name] [name]    MRN: [id number] DOB: Feb 18, 1976  08/28/2019  [name] was observed post Covid-19 immunization for 15 minutes without incident. She was provided with Vaccine Information Sheet and instruction to access the V-Safe system.   [name] was instructed to call 911 with any severe reactions post vaccine: Marland Kitchen Difficulty breathing  . Swelling of face and throat  . A fast heartbeat  . A bad rash all over body  . Dizziness and weakness   Immunizations Administered    Name Date Dose VIS Date Route   Pfizer COVID-19 Vaccine 08/28/2019  3:58 PM 0.3 mL 05/17/2019 Intramuscular   Manufacturer: [employer], [employer]   Lot: [id number]   NDC: [address]-8

## 2019-08-31 ENCOUNTER — Ambulatory Visit: Payer: 59

## 2022-03-01 ENCOUNTER — Emergency Department (HOSPITAL_COMMUNITY)
Admission: EM | Admit: 2022-03-01 | Discharge: 2022-03-01 | Discharge status: Against Medical Advice | Payer: 59 | Attending: Student | Admitting: Student

## 2022-03-01 ENCOUNTER — Encounter (HOSPITAL_COMMUNITY): Payer: Self-pay | Admitting: Emergency Medicine

## 2022-03-01 ENCOUNTER — Other Ambulatory Visit: Payer: Self-pay

## 2022-03-01 ENCOUNTER — Emergency Department (HOSPITAL_COMMUNITY): Payer: 59

## 2022-03-01 DIAGNOSIS — Z5321 Procedure and treatment not carried out due to patient leaving prior to being seen by health care provider: Secondary | ICD-10-CM | POA: Insufficient documentation

## 2022-03-01 DIAGNOSIS — R0789 Other chest pain: Secondary | ICD-10-CM | POA: Insufficient documentation

## 2022-03-01 LAB — COMPREHENSIVE METABOLIC PANEL
ALT: 6 U/L (ref 0–44)
AST: 13 U/L — ABNORMAL LOW (ref 15–41)
Albumin: 3.6 g/dL (ref 3.5–5.0)
Alkaline Phosphatase: 36 U/L — ABNORMAL LOW (ref 38–126)
Anion gap: 7 (ref 5–15)
BUN: 10 mg/dL (ref 6–20)
CO2: 22 mmol/L (ref 22–32)
Calcium: 9.3 mg/dL (ref 8.9–10.3)
Chloride: 108 mmol/L (ref 98–111)
Creatinine, Ser: 1.62 mg/dL — ABNORMAL HIGH (ref 0.44–1.00)
GFR, Estimated: 40 mL/min — ABNORMAL LOW (ref >60)
Glucose, Bld: 90 mg/dL (ref 70–99)
Potassium: 4.8 mmol/L (ref 3.5–5.1)
Sodium: 137 mmol/L (ref 135–145)
Total Bilirubin: 0.8 mg/dL (ref 0.3–1.2)
Total Protein: 8.2 g/dL — ABNORMAL HIGH (ref 6.5–8.1)

## 2022-03-01 LAB — CBC WITH DIFFERENTIAL/PLATELET
Abs Immature Granulocytes: 0 10*3/uL (ref 0.00–0.07)
Basophils Absolute: 0 10*3/uL (ref 0.0–0.1)
Basophils Relative: 0 %
Eosinophils Absolute: 0 10*3/uL (ref 0.0–0.5)
Eosinophils Relative: 1 %
HCT: 34.7 % — ABNORMAL LOW (ref 36.0–46.0)
Hemoglobin: 10 g/dL — ABNORMAL LOW (ref 12.0–15.0)
Immature Granulocytes: 0 %
Lymphocytes Relative: 38 %
Lymphs Abs: 1.6 10*3/uL (ref 0.7–4.0)
MCH: 26 pg (ref 26.0–34.0)
MCHC: 28.8 g/dL — ABNORMAL LOW (ref 30.0–36.0)
MCV: 90.1 fL (ref 80.0–100.0)
Monocytes Absolute: 0.5 10*3/uL (ref 0.1–1.0)
Monocytes Relative: 12 %
Neutro Abs: 2.1 10*3/uL (ref 1.7–7.7)
Neutrophils Relative %: 49 %
Platelets: 256 10*3/uL (ref 150–400)
RBC: 3.85 MIL/uL — ABNORMAL LOW (ref 3.87–5.11)
RDW: 12.8 % (ref 11.5–15.5)
WBC: 4.3 10*3/uL (ref 4.0–10.5)
nRBC: 0 % (ref 0.0–0.2)

## 2022-03-01 LAB — TROPONIN I (HIGH SENSITIVITY)
Troponin I (High Sensitivity): 9 ng/L (ref <18)
Troponin I (High Sensitivity): 9 ng/L (ref <18)

## 2022-03-01 NOTE — ED Provider Triage Note (Signed)
Emergency Medicine Provider Triage Evaluation Note  [name] [name] , a 46 y.o. female  was evaluated in triage.  Pt complains of chest pain.  Started yesterday.  Patient has an aching feeling.  Patient's pain has been constant.  Reports associated shortness of breath.  Patient reports recently having a procedure earlier this month after a colonoscopy.  Denies any leg swelling.  No history of PE/DVT.  Patient denies nausea, vomiting or diaphoresis.  She taken no medications for symptoms prior to arrival.  Denies any cough or fever.  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  Review of Systems  Positive: Chest pain, shortness of breath Negative: Fever, chills, leg swelling, vomiting, diaphoresis  Physical Exam  BP (!) 136/98 (BP Location: Right Arm)   Pulse 85   Temp 99.3 F (37.4 C) (Oral)   Resp 16   Ht 5\' 4"  (1.626 m)   Wt 50.8 kg   LMP 02/06/2022   SpO2 100%   BMI 19.22 kg/m  Gen:   Awake, no distress   Resp:  Normal effort  MSK:   Moves extremities without difficulty  Other:  Heart regular rate and rhythm.  Lungs clear to auscultation.,  Radial pulse are 2+ bilaterally.  Medical Decision Making  Medically screening exam initiated at 5:47 PM.  Appropriate orders placed.  [name] [name] was informed that the remainder of the evaluation will be completed by another provider, this initial triage assessment does not replace that evaluation, and the importance of remaining in the ED until their evaluation is complete.  Patient presents for chest pain.  Vital signs reassuring.  We will need to obtain lab work and x-ray imaging.  EKG shows normal sinus rhythm with a heart rate of 76 bpm without any acute ischemic changes noted.  Patient hemodynamically stable.   Doristine Devoid, PA-C 03/01/22 1750

## 2022-03-01 NOTE — ED Triage Notes (Signed)
Patient c/o left sided chest pain yesterday morning. Pain is non radiating and is an aching feeling. Repots having a procedure earlier this month after having a colonoscopy but has been having normal BMs. Speaking in full sentences and in NAD.

## 2022-03-01 NOTE — ED Notes (Signed)
Pt refused vitals and then left before going to a room.
